# Patient Record
Sex: Male | Born: 1975 | Race: White | Hispanic: No | Marital: Married | Smoking: Former smoker
Health system: Southern US, Community
[De-identification: ages and names within clinical notes are randomized; demographics above are authoritative.]

## PROBLEM LIST (undated history)

## (undated) DIAGNOSIS — J45909 Unspecified asthma, uncomplicated: Secondary | ICD-10-CM

## (undated) DIAGNOSIS — Z720 Tobacco use: Secondary | ICD-10-CM

## (undated) DIAGNOSIS — J449 Chronic obstructive pulmonary disease, unspecified: Secondary | ICD-10-CM

## (undated) HISTORY — DX: Tobacco use: Z72.0

## (undated) HISTORY — PX: DENTAL SURGERY: SHX609

---

## 2005-04-24 ENCOUNTER — Emergency Department (HOSPITAL_COMMUNITY): Admission: EM | Admit: 2005-04-24 | Discharge: 2005-04-24 | Payer: Self-pay | Admitting: Emergency Medicine

## 2011-01-27 ENCOUNTER — Other Ambulatory Visit: Payer: Self-pay | Admitting: Physician Assistant

## 2011-01-27 ENCOUNTER — Ambulatory Visit
Admission: RE | Admit: 2011-01-27 | Discharge: 2011-01-27 | Disposition: A | Discharge status: Home / Self Care | Payer: BC Managed Care – PPO | Source: Ambulatory Visit | Attending: Physician Assistant | Admitting: Physician Assistant

## 2011-01-27 DIAGNOSIS — R05 Cough: Secondary | ICD-10-CM

## 2011-01-27 DIAGNOSIS — R059 Cough, unspecified: Secondary | ICD-10-CM

## 2011-01-27 DIAGNOSIS — R0602 Shortness of breath: Secondary | ICD-10-CM

## 2014-07-05 ENCOUNTER — Encounter (HOSPITAL_COMMUNITY): Payer: Self-pay

## 2014-07-05 ENCOUNTER — Emergency Department (HOSPITAL_COMMUNITY)
Admission: EM | Admit: 2014-07-05 | Discharge: 2014-07-05 | Disposition: A | Discharge status: Home / Self Care | Payer: BC Managed Care – PPO | Attending: Emergency Medicine | Admitting: Emergency Medicine

## 2014-07-05 ENCOUNTER — Emergency Department (HOSPITAL_COMMUNITY): Payer: BC Managed Care – PPO

## 2014-07-05 DIAGNOSIS — Z720 Tobacco use: Secondary | ICD-10-CM | POA: Diagnosis not present

## 2014-07-05 DIAGNOSIS — R0602 Shortness of breath: Secondary | ICD-10-CM | POA: Insufficient documentation

## 2014-07-05 DIAGNOSIS — R109 Unspecified abdominal pain: Secondary | ICD-10-CM

## 2014-07-05 DIAGNOSIS — K529 Noninfective gastroenteritis and colitis, unspecified: Secondary | ICD-10-CM

## 2014-07-05 DIAGNOSIS — J441 Chronic obstructive pulmonary disease with (acute) exacerbation: Secondary | ICD-10-CM | POA: Diagnosis not present

## 2014-07-05 DIAGNOSIS — R112 Nausea with vomiting, unspecified: Secondary | ICD-10-CM | POA: Diagnosis present

## 2014-07-05 HISTORY — DX: Chronic obstructive pulmonary disease, unspecified: J44.9

## 2014-07-05 HISTORY — DX: Unspecified asthma, uncomplicated: J45.909

## 2014-07-05 LAB — I-STAT TROPONIN, ED: Troponin i, poc: 0 ng/mL (ref 0.00–0.08)

## 2014-07-05 LAB — CBC WITH DIFFERENTIAL/PLATELET
BASOS ABS: 0 10*3/uL (ref 0.0–0.1)
BASOS PCT: 0 % (ref 0–1)
EOS PCT: 2 % (ref 0–5)
Eosinophils Absolute: 0.4 10*3/uL (ref 0.0–0.7)
HCT: 46.2 % (ref 39.0–52.0)
Hemoglobin: 16.1 g/dL (ref 13.0–17.0)
Lymphocytes Relative: 7 % — ABNORMAL LOW (ref 12–46)
Lymphs Abs: 1.1 10*3/uL (ref 0.7–4.0)
MCH: 31.1 pg (ref 26.0–34.0)
MCHC: 34.8 g/dL (ref 30.0–36.0)
MCV: 89.2 fL (ref 78.0–100.0)
Monocytes Absolute: 0.6 10*3/uL (ref 0.1–1.0)
Monocytes Relative: 4 % (ref 3–12)
NEUTROS PCT: 87 % — AB (ref 43–77)
Neutro Abs: 13.4 10*3/uL — ABNORMAL HIGH (ref 1.7–7.7)
PLATELETS: 223 10*3/uL (ref 150–400)
RBC: 5.18 MIL/uL (ref 4.22–5.81)
RDW: 12.8 % (ref 11.5–15.5)
WBC: 15.5 10*3/uL — ABNORMAL HIGH (ref 4.0–10.5)

## 2014-07-05 LAB — COMPREHENSIVE METABOLIC PANEL
ALBUMIN: 4.4 g/dL (ref 3.5–5.2)
ALK PHOS: 79 U/L (ref 39–117)
ALT: 44 U/L (ref 0–53)
AST: 30 U/L (ref 0–37)
Anion gap: 10 (ref 5–15)
BUN: 12 mg/dL (ref 6–23)
CO2: 21 mmol/L (ref 19–32)
Calcium: 9.4 mg/dL (ref 8.4–10.5)
Chloride: 107 mmol/L (ref 96–112)
Creatinine, Ser: 0.94 mg/dL (ref 0.50–1.35)
Glucose, Bld: 106 mg/dL — ABNORMAL HIGH (ref 70–99)
Potassium: 3.4 mmol/L — ABNORMAL LOW (ref 3.5–5.1)
Sodium: 138 mmol/L (ref 135–145)
Total Bilirubin: 1.4 mg/dL — ABNORMAL HIGH (ref 0.3–1.2)
Total Protein: 7.6 g/dL (ref 6.0–8.3)

## 2014-07-05 LAB — LIPASE, BLOOD: Lipase: 25 U/L (ref 11–59)

## 2014-07-05 MED ORDER — IOHEXOL 300 MG/ML  SOLN
25.0000 mL | Freq: Once | INTRAMUSCULAR | Status: AC | PRN
  Administered 2014-07-05: 25 mL via ORAL

## 2014-07-05 MED ORDER — HYDROMORPHONE HCL 1 MG/ML IJ SOLN
1.0000 mg | INTRAMUSCULAR | Status: DC | PRN
  Administered 2014-07-05: 1 mg via INTRAVENOUS
  Filled 2014-07-05: qty 1

## 2014-07-05 MED ORDER — SODIUM CHLORIDE 0.9 % IV SOLN
1000.0000 mL | INTRAVENOUS | Status: DC
  Administered 2014-07-05: 1000 mL via INTRAVENOUS

## 2014-07-05 MED ORDER — ONDANSETRON HCL 4 MG/2ML IJ SOLN
4.0000 mg | Freq: Once | INTRAMUSCULAR | Status: AC
  Administered 2014-07-05: 4 mg via INTRAVENOUS
  Filled 2014-07-05: qty 2

## 2014-07-05 MED ORDER — HYDROCODONE-ACETAMINOPHEN 5-325 MG PO TABS: 1.0000 | ORAL_TABLET | ORAL | Status: DC | PRN

## 2014-07-05 MED ORDER — ONDANSETRON 8 MG PO TBDP
8.0000 mg | ORAL_TABLET | Freq: Three times a day (TID) | ORAL | Status: DC | PRN
Start: 2014-07-05 — End: 2015-12-14

## 2014-07-05 MED ORDER — IOHEXOL 300 MG/ML  SOLN
100.0000 mL | Freq: Once | INTRAMUSCULAR | Status: AC | PRN
  Administered 2014-07-05: 100 mL via INTRAVENOUS

## 2014-07-05 MED ORDER — SODIUM CHLORIDE 0.9 % IV SOLN
1000.0000 mL | Freq: Once | INTRAVENOUS | Status: AC
  Administered 2014-07-05: 1000 mL via INTRAVENOUS

## 2014-07-05 NOTE — ED Notes (Signed)
Bed: NA35 Expected date: 07/05/14 Expected time:  Means of arrival:  Comments: HOld  for T 1

## 2014-07-05 NOTE — ED Notes (Signed)
Lab called not enough urine to run UA

## 2014-07-05 NOTE — ED Notes (Signed)
Pt c/o increasing SOB, generalized abdominal pain, and n/v/d starting around 0500.  Pain score 8/10.  Pt reports wife previously had a "stomach bug."  Pt is also concerned that he is having an allergic reaction to nuts.  Sts he ate an entire bag of gummy bears last night and the back had an nut warning.

## 2014-07-05 NOTE — Discharge Instructions (Signed)

## 2014-07-05 NOTE — ED Provider Notes (Signed)
CSN: [id number]     Arrival date & time 07/05/14  1714 History   First MD Initiated Contact with Patient 07/05/14 1730     Chief Complaint  Patient presents with  . Shortness of Breath  . Possible Allergic Reaction      HPI  Patient started having complaints of nausea and vomiting as well as diarrhea that started today. His wife and son had similar gastroenteritis illness recently. The patient's symptoms however escalated severely today. He started having severe abdominal cramping. The pain was diffuse. He started tingling in his hands and he began feeling short of breath. Patient went to an urgent care and was sent to the emergency room to have a CAT scan and further evaluation. Nothing seems to be helping.  Patient was also concerned about the possibility of this being related to a nut allergy. He did have an entire bag of gummy bears last night and on the back of the bag mentioned a nut allergy warning. Patient has not had any rashes. He denies any hives. He denies any throat swelling. Vomited times 5 Diarrhea 30 times  Past Medical History  Diagnosis Date  . COPD (chronic obstructive pulmonary disease)   . Asthma    Past Surgical History  Procedure Laterality Date  . Dental surgery     History reviewed. No pertinent family history. History  Substance Use Topics  . Smoking status: Current Every Day Smoker -- 0.25 packs/day    Types: Cigarettes  . Smokeless tobacco: Not on file  . Alcohol Use: Yes     Comment: rarely    Review of Systems  Constitutional: Negative for fever.  Respiratory: Positive for shortness of breath. Negative for cough.   Gastrointestinal: Positive for abdominal pain.  Genitourinary: Negative for dysuria.  All other systems reviewed and are negative.     Allergies  Fluvirin  Home Medications   Prior to Admission medications   Medication Sig Start Date End Date Taking? Authorizing Provider  HYDROcodone-acetaminophen (NORCO/VICODIN) 5-325 MG  per tablet Take 1-2 tablets by mouth every 4 (four) hours as needed. 07/05/14   Dorie Rank, MD  ondansetron (ZOFRAN ODT) 8 MG disintegrating tablet Take 1 tablet (8 mg total) by mouth every 8 (eight) hours as needed for nausea or vomiting. 07/05/14   Dorie Rank, MD   BP 118/72 mmHg  Pulse 87  Temp(Src) 98.1 F (36.7 C) (Oral)  Resp 20  SpO2 100% Physical Exam  Constitutional: He appears distressed.  HENT:  Head: Normocephalic and atraumatic.  Right Ear: External ear normal.  Left Ear: External ear normal.  Eyes: Conjunctivae are normal. Right eye exhibits no discharge. Left eye exhibits no discharge. No scleral icterus.  Neck: Neck supple. No tracheal deviation present.  Cardiovascular: Normal rate, regular rhythm and intact distal pulses.   Pulmonary/Chest: Effort normal and breath sounds normal. No stridor. No respiratory distress. He has no wheezes. He has no rales.  Abdominal: Soft. Bowel sounds are normal. He exhibits no distension. There is generalized tenderness. There is guarding. There is no rigidity and no rebound. No hernia.  Musculoskeletal: He exhibits no edema or tenderness.  Neurological: He is alert. He has normal strength. No cranial nerve deficit (no facial droop, extraocular movements intact, no slurred speech) or sensory deficit. He exhibits normal muscle tone. He displays no seizure activity. Coordination normal.  Skin: Skin is warm and dry. No rash noted.  Psychiatric: He has a normal mood and affect.  Nursing note and vitals reviewed.  ED Course  Procedures (including critical care time) Labs Review Labs Reviewed  CBC WITH DIFFERENTIAL/PLATELET - Abnormal; Notable for the following:    WBC 15.5 (*)    Neutrophils Relative % 87 (*)    Neutro Abs 13.4 (*)    Lymphocytes Relative 7 (*)    All other components within normal limits  COMPREHENSIVE METABOLIC PANEL - Abnormal; Notable for the following:    Potassium 3.4 (*)    Glucose, Bld 106 (*)    Total  Bilirubin 1.4 (*)    All other components within normal limits  LIPASE, BLOOD  I-STAT TROPOININ, ED    Imaging Review Ct Abdomen Pelvis W Contrast  07/05/2014   CLINICAL DATA:  Nausea, vomiting, and diarrhea starting at 5 a.m. Increasing shortness of breath. Abdominal pain.  EXAM: CT ABDOMEN AND PELVIS WITH CONTRAST  TECHNIQUE: Multidetector CT imaging of the abdomen and pelvis was performed using the standard protocol following bolus administration of intravenous contrast.  CONTRAST:  9m OMNIPAQUE IOHEXOL 300 MG/ML SOLN, 1015mOMNIPAQUE IOHEXOL 300 MG/ML SOLN  COMPARISON:  None.  FINDINGS: Lower chest: Dependent subsegmental atelectasis in both lower lobes.  Hepatobiliary: Mild diffuse hepatic steatosis.  Pancreas: Unremarkable  Spleen: Unremarkable  Adrenals/Urinary Tract: 1.6 by 1.2 by 1.1 cm left kidney upper pole hypodense lesion, internal density measuring between 20 and 30 Hounsfield units today on the portal venous phase images, but septic to volume averaging.  Stomach/Bowel: Several borderline dilated loops of jejunum noted also with borderline fold thickening. Fatty deposition in the wall of the terminal ileum and ascending colon noted, probably incidental but with a very week association for inflammatory bowel disease. Appendix normal. Air- fluid levels in the distal colon compatible with diarrheal process.  Vascular/Lymphatic: Unremarkable  Reproductive: Unremarkable  Other: No supplemental non-categorized findings.  Musculoskeletal: Unremarkable  IMPRESSION: 1. Air- fluid levels in the distal colon compatible with diarrheal process. 2. Several borderline loops of proximal small bowel noted with borderline fold thickening, as might be seen in low grade proximal enteritis. No findings of obstruction. 3. Technically nonspecific left kidney upper pole hypodense lesion based on volume averaging, although statistically likely to be a cyst or similar benign lesion.   Electronically Signed   By:  WaVan Clines.D.   On: 07/05/2014 20:20     EKG Interpretation   Date/Time:  Sunday July 05 2014 17:23:21 EST Ventricular Rate:  65 PR Interval:  135 QRS Duration: 96 QT Interval:  391 QTC Calculation: 406 R Axis:   57 Text Interpretation:  Sinus rhythm Probable anteroseptal infarct, old ST  elevation, consider inferior injury Baseline wander in lead(s) V3 No  significant change since last tracing Confirmed by Jacson Rapaport  MD-J, Jurrell Royster  ([id number] 07/05/2014 5:31:16 PM     Medications  0.9 %  sodium chloride infusion (0 mLs Intravenous Stopped 07/05/14 1931)    Followed by  0.9 %  sodium chloride infusion (1,000 mLs Intravenous New Bag/Given 07/05/14 1756)  HYDROmorphone (DILAUDID) injection 1 mg (1 mg Intravenous Given 07/05/14 1756)  ondansetron (ZOFRAN) injection 4 mg (4 mg Intravenous Given 07/05/14 1756)  iohexol (OMNIPAQUE) 300 MG/ML solution 25 mL (25 mLs Oral Contrast Given 07/05/14 2001)  iohexol (OMNIPAQUE) 300 MG/ML solution 100 mL (100 mLs Intravenous Contrast Given 07/05/14 2001)    MDM   Final diagnoses:  Abdominal pain, acute  Gastroenteritis   Patient improved with IV fluids, pain medications and antinausea medications. CT scan is consistent with an enteritis. I suspect he has the  same illness that his family members have had. There is no evidence of obstruction, perforation, diverticulitis or appendicitis. Patient will be discharged home with medications for pain to help him with the cramping as well as anti emetics.  At this time there does not appear to be any evidence of an acute emergency medical condition and the patient appears stable for discharge with appropriate outpatient follow up.     Dorie Rank, MD 07/05/14 2128

## 2014-07-05 NOTE — ED Notes (Signed)
Pt given urinal and instructed to ring call bell when he provides urine specimen

## 2015-02-08 ENCOUNTER — Ambulatory Visit
Admission: RE | Admit: 2015-02-08 | Discharge: 2015-02-08 | Disposition: A | Discharge status: Home / Self Care | Payer: BC Managed Care – PPO | Source: Ambulatory Visit | Attending: Physician Assistant | Admitting: Physician Assistant

## 2015-02-08 ENCOUNTER — Other Ambulatory Visit: Payer: Self-pay | Admitting: Physician Assistant

## 2015-02-08 DIAGNOSIS — R05 Cough: Secondary | ICD-10-CM

## 2015-02-08 DIAGNOSIS — R059 Cough, unspecified: Secondary | ICD-10-CM

## 2015-02-08 DIAGNOSIS — R0602 Shortness of breath: Secondary | ICD-10-CM

## 2015-08-04 IMAGING — CT CT ABD-PELV W/ CM
1 of 2 series · 15 of 32 positions shown, 19 images · IV contrast (OMNIPAQUE 300)
Comparison: None.

CLINICAL DATA: Nausea, vomiting, and diarrhea starting at 5 a.m.
Increasing shortness of breath. Abdominal pain.

EXAM:
CT ABDOMEN AND PELVIS WITH CONTRAST
TECHNIQUE: Multidetector CT imaging of the abdomen and pelvis was performed
using the standard protocol following bolus administration of
intravenous contrast.
CONTRAST:  25mL OMNIPAQUE IOHEXOL 300 MG/ML SOLN, 100mL OMNIPAQUE
IOHEXOL 300 MG/ML SOLN

[Series 2: abd/pel with · axial · 0.80mm/px · z∈[+1015,+1470]mm · 15 of 101 slices shown, 19 images]
[im 5/101  soft-tissue]
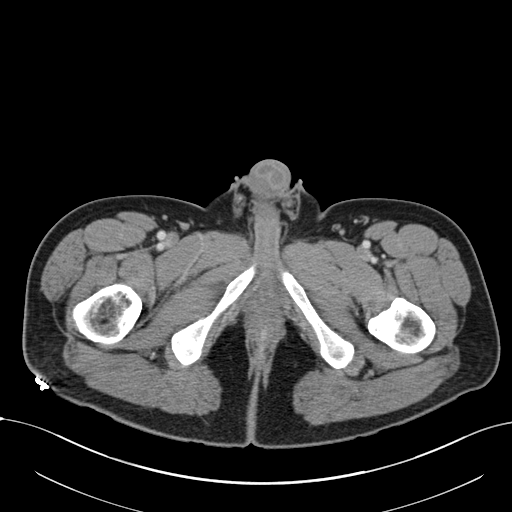
[im 5/101  bone]
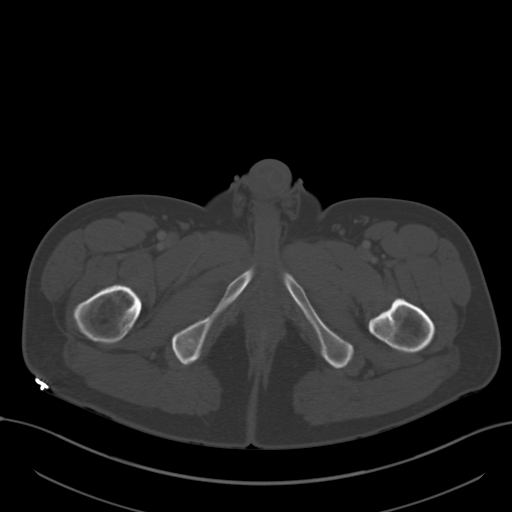
[im 14/101  soft-tissue]
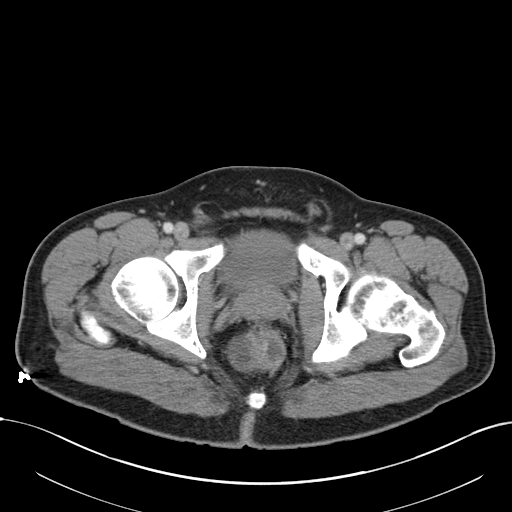
[im 22/101  soft-tissue]
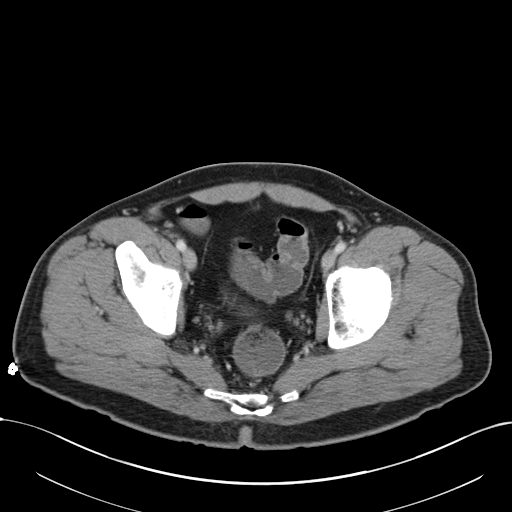
[im 27/101  soft-tissue]
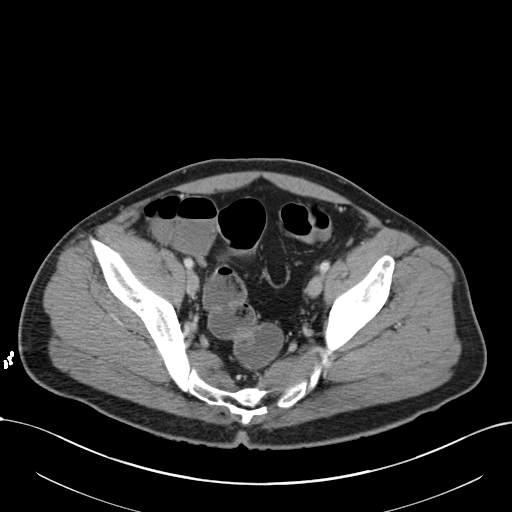
[im 35/101  soft-tissue]
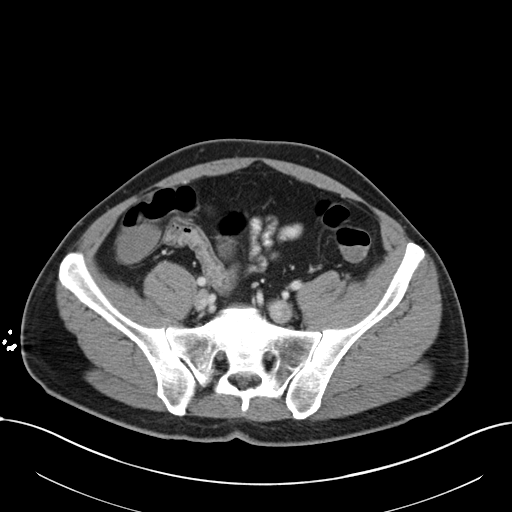
[im 44/101  soft-tissue]
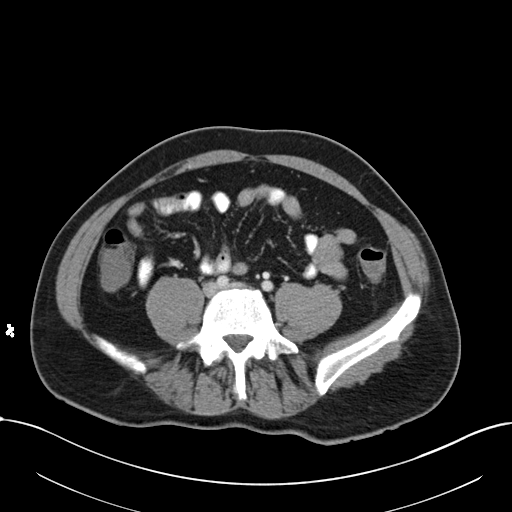
[im 53/101  soft-tissue]
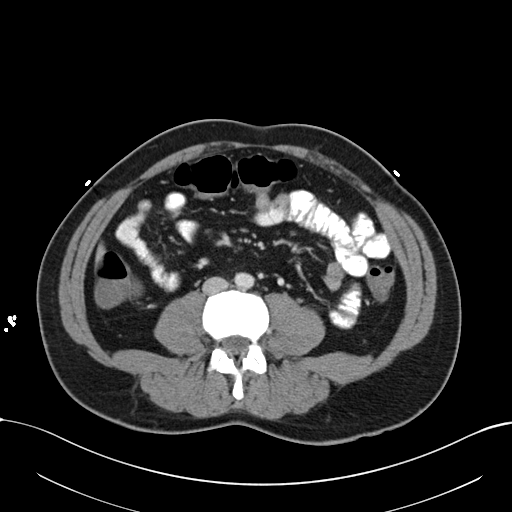
[im 57/101  soft-tissue]
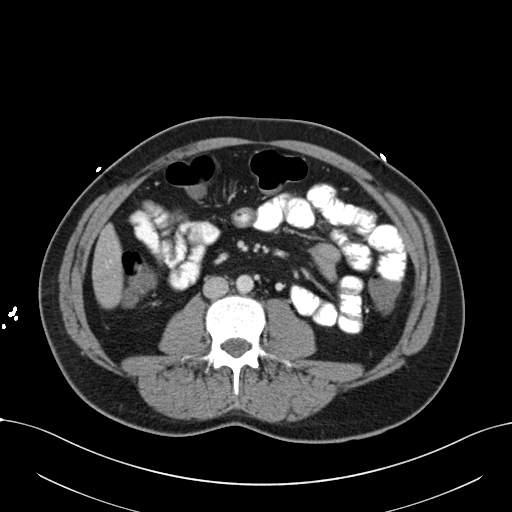
[im 66/101  soft-tissue]
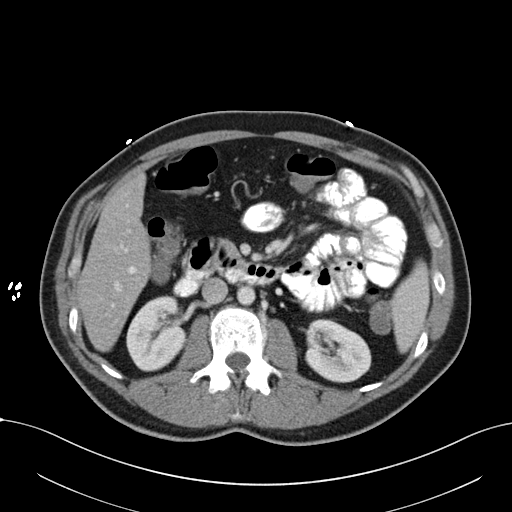
[im 66/101  bone]
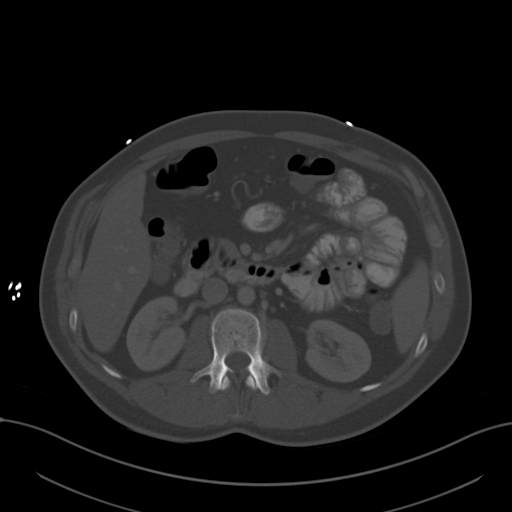
[im 74/101  soft-tissue]
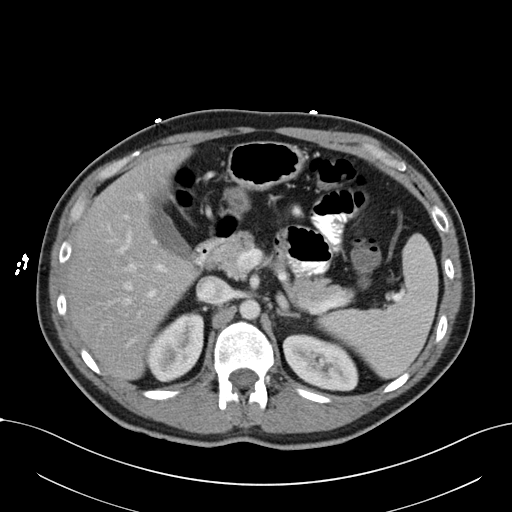
[im 79/101  soft-tissue]
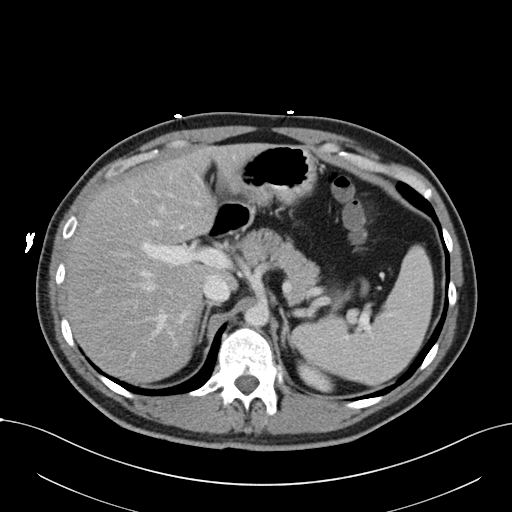
[im 83/101  lung]
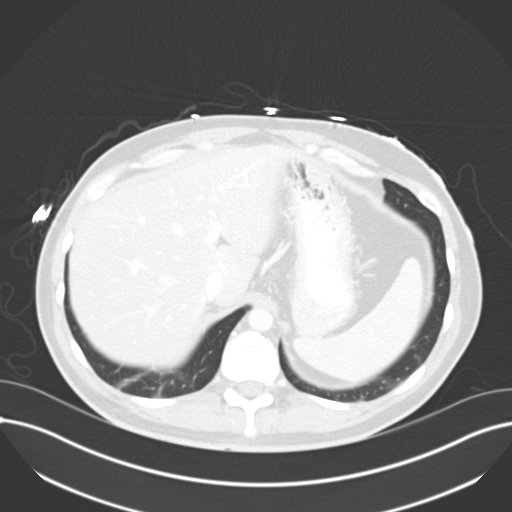
[im 87/101  soft-tissue]
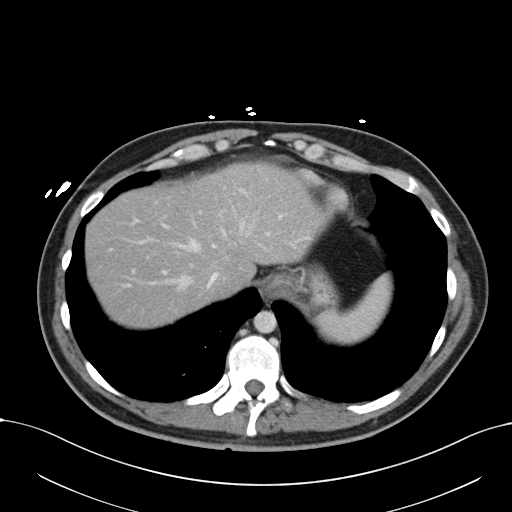
[im 87/101  lung]
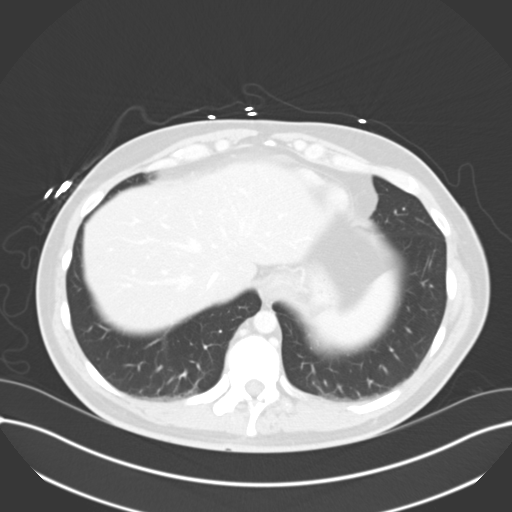
[im 92/101  lung]
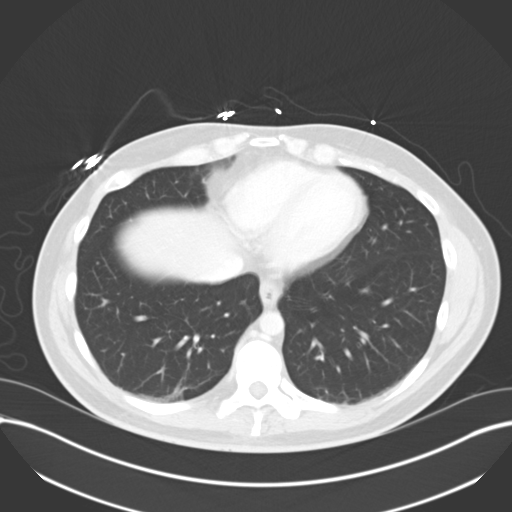
[im 96/101  soft-tissue]
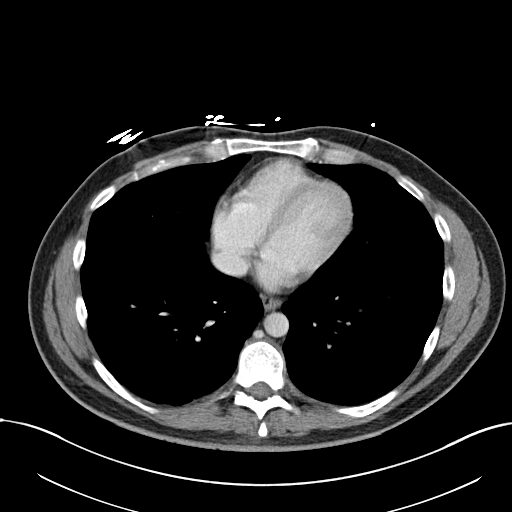
[im 96/101  lung]
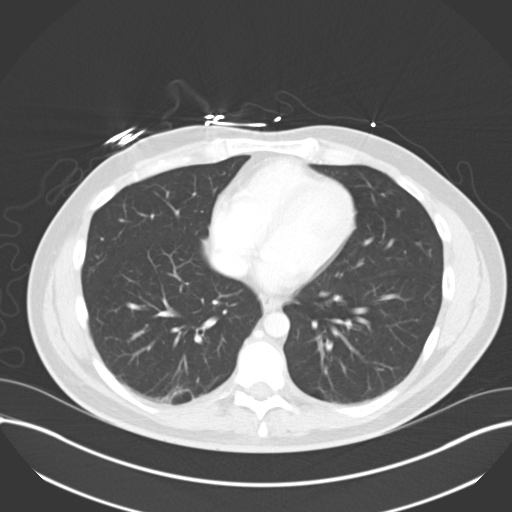

[15 of 32 positions shown; findings below may reference images not displayed]

FINDINGS: Lower chest: Dependent subsegmental atelectasis in both lower lobes.

Hepatobiliary: Mild diffuse hepatic steatosis.

Pancreas: Unremarkable

Spleen: Unremarkable

Adrenals/Urinary Tract: 1.6 by 1.2 by 1.1 cm left kidney upper pole
hypodense lesion, internal density measuring between 20 and 30
Hounsfield units today on the portal venous phase images, but septic
to volume averaging.

Stomach/Bowel: Several borderline dilated loops of jejunum noted
also with borderline fold thickening. Fatty deposition in the wall
of the terminal ileum and ascending colon noted, probably incidental
but with a very week association for inflammatory bowel disease.
Appendix normal. Air- fluid levels in the distal colon compatible
with diarrheal process.

Vascular/Lymphatic: Unremarkable

Reproductive: Unremarkable

Other: No supplemental non-categorized findings.

Musculoskeletal: Unremarkable
IMPRESSION: 1. Air- fluid levels in the distal colon compatible with diarrheal
process.
2. Several borderline loops of proximal small bowel noted with
borderline fold thickening, as might be seen in low grade proximal
enteritis. No findings of obstruction.
3. Technically nonspecific left kidney upper pole hypodense lesion
based on volume averaging, although statistically likely to be a
cyst or similar benign lesion.

## 2015-12-14 ENCOUNTER — Encounter: Payer: Self-pay | Admitting: Allergy and Immunology

## 2015-12-14 ENCOUNTER — Encounter (INDEPENDENT_AMBULATORY_CARE_PROVIDER_SITE_OTHER): Payer: Self-pay

## 2015-12-14 ENCOUNTER — Ambulatory Visit (INDEPENDENT_AMBULATORY_CARE_PROVIDER_SITE_OTHER): Payer: BC Managed Care – PPO | Admitting: Allergy and Immunology

## 2015-12-14 VITALS — BP 122/86 | HR 80 | Resp 20 | Ht 70.39 in | Wt 185.0 lb

## 2015-12-14 DIAGNOSIS — Z7722 Contact with and (suspected) exposure to environmental tobacco smoke (acute) (chronic): Secondary | ICD-10-CM

## 2015-12-14 DIAGNOSIS — H1010 Acute atopic conjunctivitis, unspecified eye: Secondary | ICD-10-CM | POA: Diagnosis not present

## 2015-12-14 DIAGNOSIS — J309 Allergic rhinitis, unspecified: Secondary | ICD-10-CM

## 2015-12-14 DIAGNOSIS — J4531 Mild persistent asthma with (acute) exacerbation: Secondary | ICD-10-CM

## 2015-12-14 MED ORDER — BUDESONIDE 32 MCG/ACT NA SUSP: 1.0000 | Freq: Every day | NASAL | 0 refills | Status: AC

## 2015-12-14 MED ORDER — FLUTICASONE FUROATE 200 MCG/ACT IN AEPB: 1.0000 | INHALATION_SPRAY | Freq: Every day | RESPIRATORY_TRACT | 5 refills | Status: AC

## 2015-12-14 MED ORDER — METHYLPREDNISOLONE ACETATE 80 MG/ML IJ SUSP
80.0000 mg | Freq: Once | INTRAMUSCULAR | Status: AC
  Administered 2015-12-14: 80 mg via INTRAMUSCULAR

## 2015-12-14 NOTE — Patient Instructions (Addendum)
  1. Allergen avoidance measures and eliminate indoor tobacco smoke exposure  2. Treat and prevent inflammation:   A. continue montelukast 10 mg tablet 1 time a day  B. start OTC Rhinocort one spray each nostril one time a day. Coupon. Sample  C. start Arnuity 200 one inhalation 1 time per day. Coupon. Sample.  D. Depo-Medrol 80 IM delivered in clinic today  3. If needed:   A. OTC antihistamine - Claritin/Zyrtec/Allegra   B. ProAir HFA 2 puffs every 4-6 hours  4. Consider immunotherapy?  5. Return to clinic in 4 weeks or earlier if problem   6. Obtain fall flu vaccine

## 2015-12-14 NOTE — Progress Notes (Signed)
Dear Lennie Odor,  Thank you for referring Logan Mora to the [facility] on 12/14/2015.   Below is a summation of this patient's evaluation and recommendations.  Thank you for your referral. I will keep you informed about this patient's response to treatment.   If you have any questions please to do hesitate to contact me.   Sincerely,  Jiles Prows, MD [facility]   ______________________________________________________________________    NEW PATIENT NOTE  Referring Provider: Lennie Odor, PA-C Primary Provider: Donnie Coffin, MD Date of office visit: 12/14/2015    Subjective:   Chief Complaint:  Logan Mora (DOB: 26-Feb-1976) is a 40 y.o. male with a chief complaint of Asthma  who presents to the clinic on 12/14/2015 with the following problems:  HPI: Logan Mora presents this clinic in evaluation of asthma and allergic rhinitis.  Logan Mora has a long history of a upper airway issues dating back to middle school presenting as nasal congestion and sneezing and nose blowing and itchy red watery eyes. This has been somewhat progressive and has been quite significant since Logan Mora obtained his dog in June 2017. Logan Mora's been using montelukast which Logan Mora thinks does help both his nose and eyes yet Logan Mora still remains somewhat symptomatic.  Logan Mora has had problems with asthma for at least 10 years requiring an emergency room evaluation 10 years ago at which time Logan Mora was given a short acting bronchodilator. Apparently Logan Mora was doing relatively well while relying on the use of a short acting bronchodilator but Logan Mora has noticed since June that Logan Mora has increased his requirement for short acting bronchodilator up to 3 times per day for issues with wheezing and coughing and shortness of breath. Logan Mora will wake up around 5:00 in the morning with wheezing and coughing and mucus production and Logan Mora must use a short  acting bronchodilator at that point. Once again Logan Mora thinks that this is somewhat worse since Logan Mora obtained his dog in June 2017. Logan Mora did require a emergency room evaluation back in September 2016 for a febrile illness associated with an asthma flare at which time Logan Mora was started on a controller agent in addition to a short acting bronchodilator but it does not sound as though Logan Mora continued on his controller agent and just relied on the use of a short-acting bronchodilator.  Past Medical History:  Diagnosis Date  . Asthma   . COPD (chronic obstructive pulmonary disease) ([place])   . Tobacco abuse     Past Surgical History:  Procedure Laterality Date  . DENTAL SURGERY    . DENTAL SURGERY     Dental Implants      Medication List      montelukast 10 MG tablet Commonly known as:  SINGULAIR Take 10 mg by mouth daily.   PROAIR HFA 108 (90 Base) MCG/ACT inhaler Generic drug:  albuterol Inhale two puffs every four to six hours as needed for cough or wheeze.       Allergies  Allergen Reactions  . Fluvirin [Influenza Virus Vaccine Split] Shortness Of Breath    Gets the Flu, body aches    Review of systems negative except as noted in HPI / PMHx or noted below:  Review of Systems  Constitutional: Negative.   HENT: Negative.   Eyes: Negative.   Respiratory: Negative.   Cardiovascular: Negative.   Gastrointestinal: Negative.        Developed very significant problems  with abdominal pain and diarrhea approximately 24 hours after eating pecans. Logan Mora never has any associated systemic or constitutional symptoms and Logan Mora can eat all other tree nuts with no problem.  Genitourinary: Negative.   Musculoskeletal: Negative.   Skin: Negative.   Neurological: Negative.   Endo/Heme/Allergies: Negative.   Psychiatric/Behavioral: Negative.     Family History  Problem Relation Age of Onset  . Bone cancer Maternal Grandmother   . Heart disease Maternal Grandfather   . Heart disease Paternal Grandfather      Social History   Social History  . Marital status: Married    Spouse name: N/A  . Number of children: N/A  . Years of education: N/A   Occupational History  . Not on file.   Social History Main Topics  . Smoking status: Former Smoker    Packs/day: 0.25    Types: Cigarettes  . Smokeless tobacco: Never Used  . Alcohol use Yes     Comment: rarely  . Drug use: No  . Sexual activity: Not on file   Other Topics Concern  . Not on file   Social History Narrative  . No narrative on file    Environmental and Social history  Lives in a house with a dry environment, a dog located inside the household, wood flooring in the bedroom, no plastic on the bed or pillow, and a wife who smokes tobacco products indoors. Logan Mora will occasionally smoke a few cigarettes very intermittently if his wife is smoking around him. 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   Objective:   Vitals:   12/14/15 1018  BP: 122/86  Pulse: 80  Resp: 20   Height: 5' 10.39" (178.8 cm) Weight: 185 lb (83.9 kg)  Physical Exam  Constitutional: Logan Mora is well-developed, well-nourished, and in no distress.  HENT:  Head: Normocephalic. Head is without right periorbital erythema and without left periorbital erythema.  Right Ear: Tympanic membrane, external ear and ear canal normal.  Left Ear: Tympanic membrane, external ear and ear canal normal.  Nose: Mucosal edema present. No rhinorrhea.  Mouth/Throat: Oropharynx is clear and moist and mucous membranes are normal. No oropharyngeal exudate.  Eyes: Conjunctivae and lids are normal. Pupils are equal, round, and reactive to light.  Neck: Trachea normal. No tracheal deviation present. No thyromegaly present.  Cardiovascular: Normal rate, regular rhythm, S1 normal, S2 normal and normal heart sounds.   No murmur heard. Pulmonary/Chest: Effort normal. No stridor. No tachypnea. No respiratory distress. Logan Mora has no wheezes. Logan Mora has no rales. Logan Mora exhibits no tenderness.  Abdominal: Soft. Logan Mora exhibits  no distension and no mass. There is no hepatosplenomegaly. There is no tenderness. There is no rebound and no guarding.  Musculoskeletal: Logan Mora exhibits no edema or tenderness.  Lymphadenopathy:       Head (right side): No tonsillar adenopathy present.       Head (left side): No tonsillar adenopathy present.    Logan Mora has no cervical adenopathy.    Logan Mora has no axillary adenopathy.  Neurological: Logan Mora is alert. Gait normal.  Skin: No rash noted. Logan Mora is not diaphoretic. No erythema. No pallor. Nails show no clubbing.  Psychiatric: Mood and affect normal.     Diagnostics: Allergy skin tests were performed. Logan Mora demonstrated severe hypersensitivity against dog and cat and slight hypersensitivity against house dust mite and trees. Logan Mora did not demonstrate any hypersensitivity against pecan  Spirometry was performed and demonstrated an FEV1 of 4.26 @ 102 % of predicted. Following the administration of nebulized albuterol his FEV1  did not change significantly.  The patient had an Asthma Control Test with the following results:  .     Assessment and Plan:    1. Asthma, not well controlled, mild persistent, with acute exacerbation   2. Allergic rhinoconjunctivitis   3. Second hand tobacco smoke exposure     1. Allergen avoidance measures and eliminate indoor tobacco smoke exposure  2. Treat and prevent inflammation:   A. continue montelukast 10 mg tablet 1 time a day  B. start OTC Rhinocort one spray each nostril one time a day. Coupon. Sample  C. start Arnuity 200 one inhalation 1 time per day. Coupon. Sample.  D. Depo-Medrol 80 IM delivered in clinic today  3. If needed:   A. OTC antihistamine - Claritin/Zyrtec/Allegra   B. ProAir HFA 2 puffs every 4-6 hours  4. Consider immunotherapy?  5. Return to clinic in 4 weeks or earlier if problem   6. Obtain fall flu vaccine  I will have Logan Mora focus on allergen avoidance measures, eliminating the respiratory irritant of smoke exposure, and have him  use anti-inflammatory agents for both his upper and lower airways and we'll see what type of response Logan Mora will receive over the course the next 4 weeks or so. Logan Mora would definitely be a candidate for immunotherapy should Logan Mora fail this form of treatment. I did give him literature on immunotherapy during today's visit. I'll regroup with him in 4 weeks or earlier if there is a problem. I did encourage him to get the flu vaccine this fall.  Jiles Prows, MD [facility]

## 2016-01-18 ENCOUNTER — Ambulatory Visit: Payer: BC Managed Care – PPO | Admitting: Allergy and Immunology

## 2016-03-09 IMAGING — CR DG CHEST 2V
2 series · 2 of 2 positions shown · non-contrast
Comparison: 03/01/2011

CLINICAL DATA: Cough for 3 weeks, congestion, wheezing, chills.

EXAM:
CHEST  2 VIEW

[view not recorded (1 of 2)]
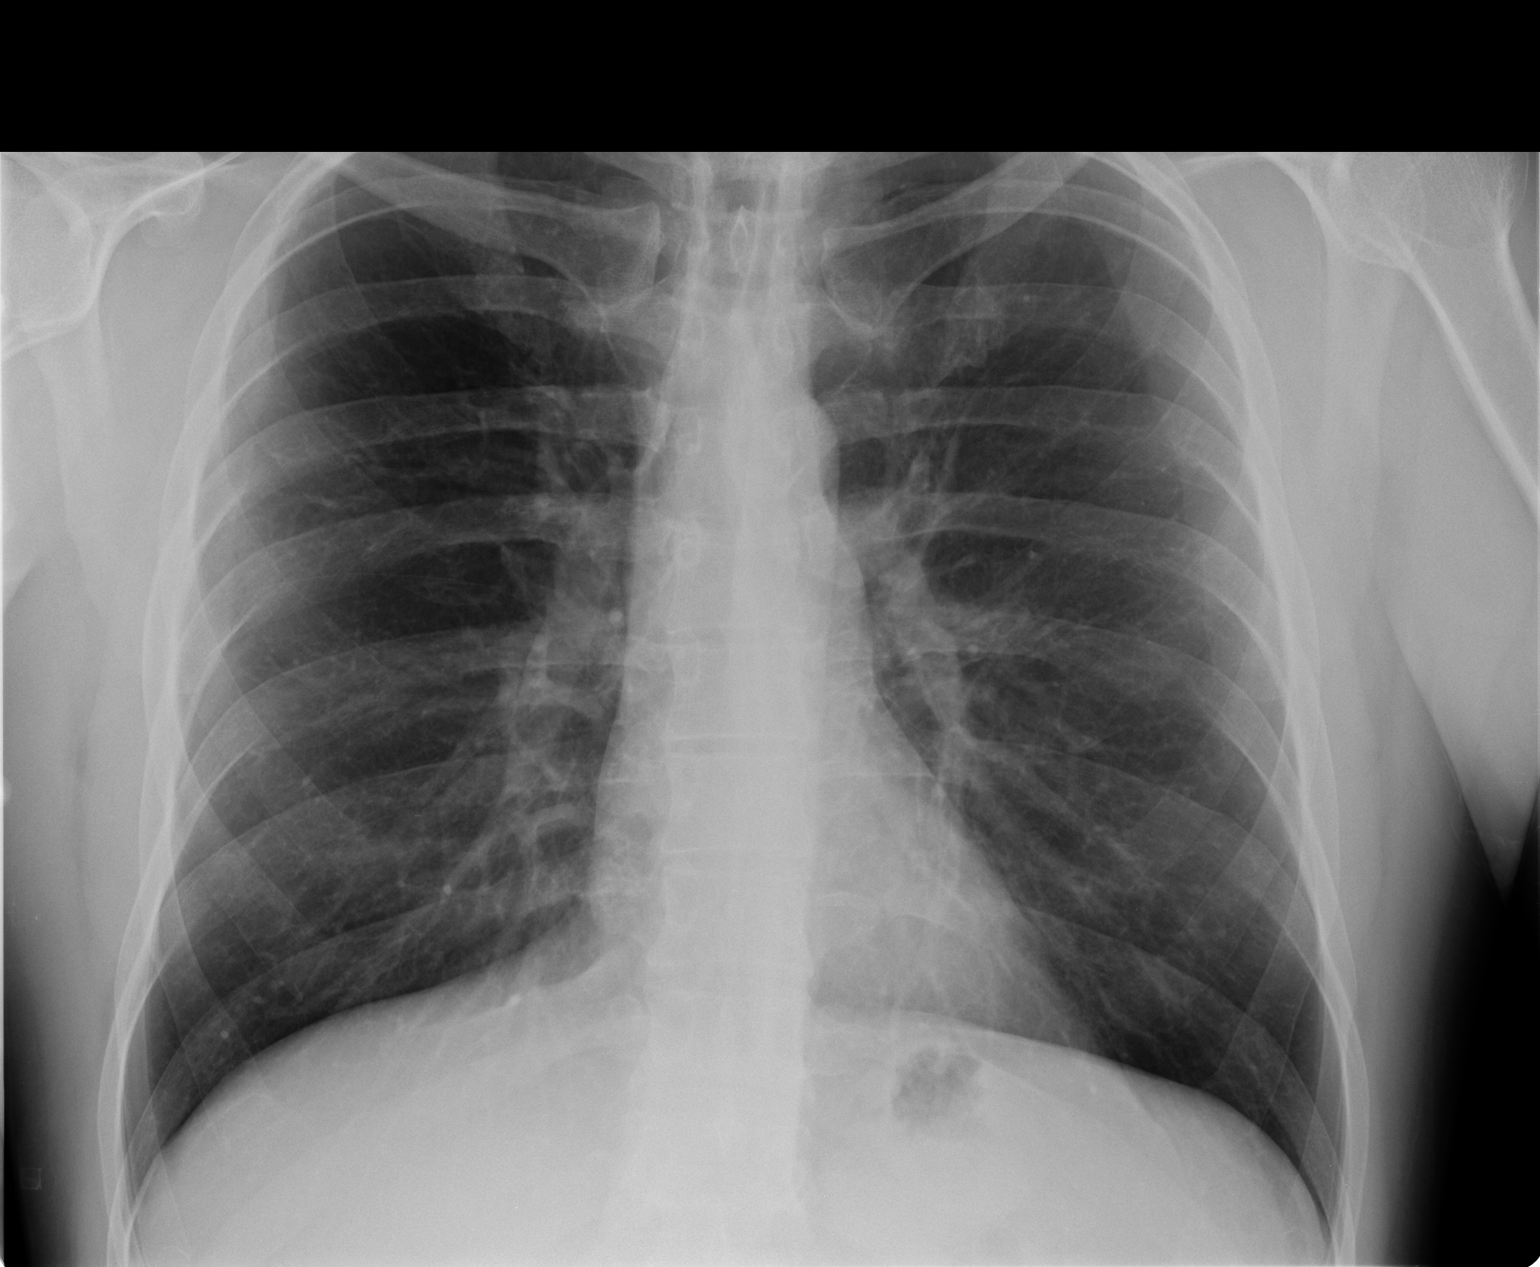

[view not recorded (2 of 2)]
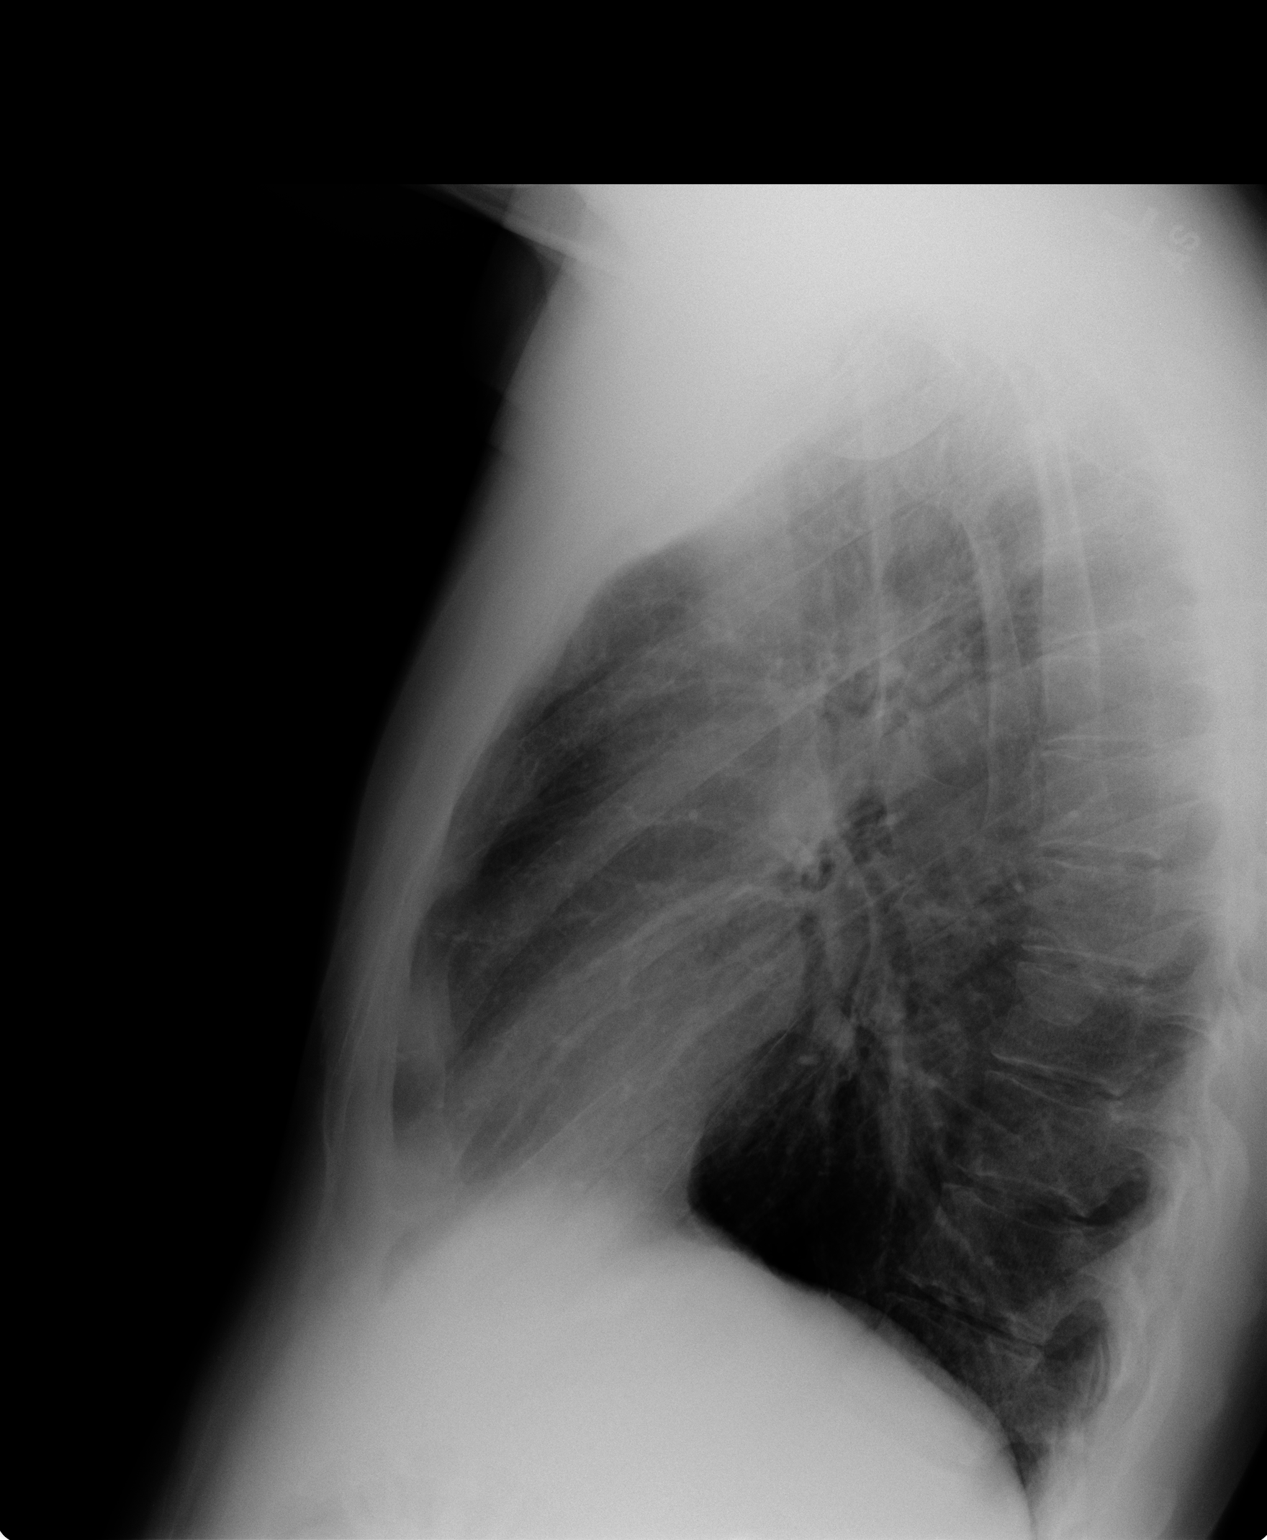

[2 of 2 positions shown; findings below may reference images not displayed]

FINDINGS: The heart size and mediastinal contours are within normal limits.
Both lungs are clear. The visualized skeletal structures are
unremarkable.
IMPRESSION: No active cardiopulmonary disease.

## 2019-06-21 ENCOUNTER — Ambulatory Visit: Payer: BC Managed Care – PPO

## 2019-09-10 ENCOUNTER — Other Ambulatory Visit: Payer: Self-pay

## 2019-09-10 ENCOUNTER — Ambulatory Visit (INDEPENDENT_AMBULATORY_CARE_PROVIDER_SITE_OTHER): Payer: BC Managed Care – PPO | Admitting: Ophthalmology

## 2019-09-10 ENCOUNTER — Encounter (INDEPENDENT_AMBULATORY_CARE_PROVIDER_SITE_OTHER): Payer: Self-pay | Admitting: Ophthalmology

## 2019-09-10 DIAGNOSIS — H353132 Nonexudative age-related macular degeneration, bilateral, intermediate dry stage: Secondary | ICD-10-CM | POA: Diagnosis not present

## 2019-09-10 NOTE — Progress Notes (Signed)
09/10/2019     CHIEF COMPLAINT Patient presents for Retina Evaluation   HISTORY OF PRESENT ILLNESS: Logan Mora is a 44 y.o. male who presents to the clinic today for:   HPI    Retina Evaluation    In right eye.  This started 2 weeks ago.  Duration of 2 weeks.  Associated Symptoms Blind Spot.  Context:  distance vision, mid-range vision and near vision.          Comments    NP - self-referred for second opinion of blind spot OD  Pt c/o "blind spot" OD x 2 weeks approx. Pt sts the spot is located off-center and temporal. Pt sts the blind spot has improved since initial onset. Pt reports intermittent distortion OD x 2 weeks. **Pt sts this has occurred previously approx 3 years ago, and resolved on its own. Pt denies symptoms OS. Pt sts he was seen at [facility] and had a "dye test" but did not get intravitreal injection. Pt sts he was then seen by Dr. Baird Cancer and received series of Avastin x 3 in 2018. Pt reports occasional "wiggly line" OS. Pt sts when he was looking at his fingers x 1 week ago with OD, parts of his finger temporally seemed to be less clear. Pt reports continued distortion OD.        Last edited by Hurman Horn, MD on 09/10/2019  9:27 AM. (History)      Referring physician: Alroy Dust, [address], [place],  [place] [address]  HISTORICAL INFORMATION:   Selected notes from the [place]: No current outpatient medications on file. (Ophthalmic Drugs)   No current facility-administered medications for this visit. (Ophthalmic Drugs)   Current Outpatient Medications (Other)  Medication Sig  . budesonide (RHINOCORT ALLERGY) 32 MCG/ACT nasal spray Place 1 spray into both nostrils daily.  . Fluticasone Furoate (ARNUITY ELLIPTA) 200 MCG/ACT AEPB Inhale 1 Dose into the lungs daily. Rinse, gargle, and spit after use.  . montelukast (SINGULAIR) 10 MG tablet Take 10 mg by mouth daily.   Marland Kitchen PROAIR HFA 108 (90  Base) MCG/ACT inhaler Inhale two puffs every four to six hours as needed for cough or wheeze.   No current facility-administered medications for this visit. (Other)      REVIEW OF SYSTEMS:    ALLERGIES Allergies  Allergen Reactions  . Fluvirin [Influenza Vac Split Quad] Shortness Of Breath    Gets the Flu, body aches    PAST MEDICAL HISTORY Past Medical History:  Diagnosis Date  . Asthma   . COPD (chronic obstructive pulmonary disease) ([place])   . Tobacco abuse    Past Surgical History:  Procedure Laterality Date  . DENTAL SURGERY    . DENTAL SURGERY     Dental Implants    FAMILY HISTORY Family History  Problem Relation Age of Onset  . Bone cancer Maternal Grandmother   . Heart disease Maternal Grandfather   . Heart disease Paternal Grandfather     SOCIAL HISTORY Social History   Tobacco Use  . Smoking status: Former Smoker    Packs/day: 0.25    Types: Cigarettes  . Smokeless tobacco: Never Used  Substance Use Topics  . Alcohol use: Yes    Comment: rarely  . Drug use: No         OPHTHALMIC EXAM:  Base Eye Exam    Visual Acuity (ETDRS)  Right Left   Dist [place] 20/25 +2 20/25 +2       Tonometry (Tonopen, 8:22 AM)      Right Left   Pressure 14 14       Pupils      Pupils Dark Light Shape React APD   Right PERRL 4 3 Round Brisk None   Left PERRL 4 3 Round Brisk None       Visual Fields (Counting fingers)      Left Right    Full Full       Extraocular Movement      Right Left    Full Full       Neuro/Psych    Oriented x3: Yes   Mood/Affect: Normal       Dilation    Both eyes: 1.0% Mydriacyl, 2.5% Phenylephrine @ 8:22 AM        Slit Lamp and Fundus Exam    External Exam      Right Left   External Normal Normal       Slit Lamp Exam      Right Left   Lids/Lashes Normal Normal   Conjunctiva/Sclera White and quiet White and quiet   Cornea Clear Clear   Anterior Chamber Deep and quiet Deep and quiet   Iris Round and  reactive Round and reactive   Lens Clear Clear   Anterior Vitreous Normal Normal       Fundus Exam      Right Left   Posterior Vitreous Normal Normal   Disc Normal Normal   C/D Ratio 0.3 0.4   Macula Soft drusen, no macular thickening, no membrane, Retinal pigment epithelial mottling, Early age related macular degeneration, Retinal pigment epithelial atrophy Soft drusen, no macular thickening, no membrane, Retinal pigment epithelial mottling, Early age related macular degeneration, Retinal pigment epithelial atrophy   Vessels Normal Normal   Periphery Normal no pisciform flecks seen in periphery Normal no pisciform flecks seen in periphery          IMAGING AND PROCEDURES  Imaging and Procedures for 09/13/19  OCT, Retina - OU - Both Eyes       Right Eye Quality was good. Scan locations included subfoveal. Central Foveal Thickness: 261. Findings include retinal drusen , cystoid macular edema.   Left Eye Quality was good. Central Foveal Thickness: 264.   Notes Micro CME, in [place] OD consider sleep apnea testing if patient snores, he does       Color Fundus Photography Optos - OU - Both Eyes       Right Eye Progression has no prior data. Disc findings include normal observations. Macula : drusen, geographic atrophy. Vessels : normal observations. Periphery : normal observations.   Left Eye Progression has no prior data. Disc findings include normal observations. Macula : geographic atrophy, drusen. Vessels : normal observations. Periphery : normal observations.   Notes Intermediate form of age-related maculopathy.       Fluorescein Angiography Optos (Transit OD)       Right Eye   Progression has no prior data. Early phase findings include window defect. Mid/Late phase findings include window defect, staining. Choroidal neovascularization is not present.   Left Eye   Progression has no prior data. Early phase findings include window defect. Mid/Late phase  findings include window defect, staining. Choroidal neovascularization is not present.   Notes Bilateral moderate to intermediate age-related maculopathy.  No evidence of CNVM.  No evidence of peripheral pisciform deposits.  No evidence for widespread  atrophy of Stargardt's disease                ASSESSMENT/PLAN:  No problem-specific Assessment & Plan notes found for this encounter.      ICD-10-CM   1. Intermediate stage nonexudative age-related macular degeneration of both eyes  H35.3132 OCT, Retina - OU - Both Eyes    Color Fundus Photography Optos - OU - Both Eyes    Fluorescein Angiography Optos (Transit OD)    1.  I spent much time with the patient discussing all factors that might lead to hypooxygenation of the macula given his likely genetic disposition to age-related macular degeneration.  Patient does report that he snores.  He has never had formal evaluation for sleep apnea 2.  Encourage patient to seek further investigation of this potentially download snore lab for screening purposes at home and then potentially arranging for sleep apnea testing formally with his PCP  3.  Otherwise looking for new onset distortions in his vision and at home monitoring with [place] grid as he is already performing  Ophthalmic Meds Ordered this visit:  No orders of the defined types were placed in this encounter.      Return in about 2 months (around 11/10/2019) for DILATE OU, OCT.  There are no Patient Instructions on file for this visit.   Explained the diagnoses, plan, and follow up with the patient and they expressed understanding.  Patient expressed understanding of the importance of proper follow up care.   Clent Demark Shalon Salado M.D. Diseases & Surgery of the Retina and Vitreous Retina & Diabetic [place] 09/13/19     Abbreviations: M myopia (nearsighted); A astigmatism; H hyperopia (farsighted); P presbyopia; Mrx spectacle prescription;  CTL contact lenses; OD right eye; OS  left eye; OU both eyes  XT exotropia; ET esotropia; PEK punctate epithelial keratitis; PEE punctate epithelial erosions; DES dry eye syndrome; MGD meibomian gland dysfunction; ATs artificial tears; PFAT's preservative free artificial tears; [place] nuclear sclerotic cataract; PSC posterior subcapsular cataract; ERM epi-retinal membrane; PVD posterior vitreous detachment; RD retinal detachment; DM diabetes mellitus; DR diabetic retinopathy; NPDR non-proliferative diabetic retinopathy; PDR proliferative diabetic retinopathy; CSME clinically significant macular edema; DME diabetic macular edema; dbh dot blot hemorrhages; CWS cotton wool spot; POAG primary open angle glaucoma; C/D cup-to-disc ratio; HVF humphrey visual field; GVF goldmann visual field; OCT optical coherence tomography; IOP intraocular pressure; BRVO Branch retinal vein occlusion; CRVO central retinal vein occlusion; CRAO central retinal artery occlusion; BRAO branch retinal artery occlusion; RT retinal tear; SB scleral buckle; PPV pars plana vitrectomy; VH Vitreous hemorrhage; PRP panretinal laser photocoagulation; IVK intravitreal kenalog; VMT vitreomacular traction; MH Macular hole;  NVD neovascularization of the disc; NVE neovascularization elsewhere; AREDS age related eye disease study; ARMD age related macular degeneration; POAG primary open angle glaucoma; EBMD epithelial/anterior basement membrane dystrophy; ACIOL anterior chamber intraocular lens; IOL intraocular lens; PCIOL posterior chamber intraocular lens; Phaco/IOL phacoemulsification with intraocular lens placement; [place] photorefractive keratectomy; LASIK laser assisted in situ keratomileusis; HTN hypertension; DM diabetes mellitus; COPD chronic obstructive pulmonary disease

## 2019-11-11 ENCOUNTER — Encounter (INDEPENDENT_AMBULATORY_CARE_PROVIDER_SITE_OTHER): Payer: BC Managed Care – PPO | Admitting: Ophthalmology

## 2019-11-18 ENCOUNTER — Other Ambulatory Visit: Payer: Self-pay

## 2019-11-18 ENCOUNTER — Encounter (INDEPENDENT_AMBULATORY_CARE_PROVIDER_SITE_OTHER): Payer: Self-pay | Admitting: Ophthalmology

## 2019-11-18 ENCOUNTER — Ambulatory Visit (INDEPENDENT_AMBULATORY_CARE_PROVIDER_SITE_OTHER): Payer: BC Managed Care – PPO | Admitting: Ophthalmology

## 2019-11-18 DIAGNOSIS — H353211 Exudative age-related macular degeneration, right eye, with active choroidal neovascularization: Secondary | ICD-10-CM

## 2019-11-18 DIAGNOSIS — H353212 Exudative age-related macular degeneration, right eye, with inactive choroidal neovascularization: Secondary | ICD-10-CM | POA: Insufficient documentation

## 2019-11-18 DIAGNOSIS — H3561 Retinal hemorrhage, right eye: Secondary | ICD-10-CM | POA: Diagnosis not present

## 2019-11-18 HISTORY — DX: Retinal hemorrhage, right eye: H35.61

## 2019-11-18 MED ORDER — BEVACIZUMAB CHEMO INJECTION 1.25MG/0.05ML SYRINGE FOR KALEIDOSCOPE
1.2500 mg | INTRAVITREAL | Status: AC | PRN
  Administered 2019-11-18: 1.25 mg via INTRAVITREAL

## 2019-11-18 NOTE — Progress Notes (Signed)
11/18/2019     CHIEF COMPLAINT Patient presents for Retina Follow Up   HISTORY OF PRESENT ILLNESS: Logan Mora is a 44 y.o. male who presents to the clinic today for:   HPI    Retina Follow Up    Patient presents with  Dry AMD.  In both eyes.  Duration of 2 months.  Since onset it is gradually worsening.          Comments    2 month follow up - OCT OU Patient states that his right eye has gotten worse since last visit. Patient states there is a blind spot that things go into.       Last edited by Gerda Diss on 11/18/2019  8:04 AM. (History)      Referring physician: Merrilee Seashore, MD [address] [place],  [place] [address]  HISTORICAL INFORMATION:   Selected notes from the MEDICAL RECORD NUMBER       CURRENT MEDICATIONS: No current outpatient medications on file. (Ophthalmic Drugs)   No current facility-administered medications for this visit. (Ophthalmic Drugs)   Current Outpatient Medications (Other)  Medication Sig  . budesonide (RHINOCORT ALLERGY) 32 MCG/ACT nasal spray Place 1 spray into both nostrils daily.  . Fluticasone Furoate (ARNUITY ELLIPTA) 200 MCG/ACT AEPB Inhale 1 Dose into the lungs daily. Rinse, gargle, and spit after use.  . montelukast (SINGULAIR) 10 MG tablet Take 10 mg by mouth daily.   Marland Kitchen PROAIR HFA 108 (90 Base) MCG/ACT inhaler Inhale two puffs every four to six hours as needed for cough or wheeze.   No current facility-administered medications for this visit. (Other)      REVIEW OF SYSTEMS:    ALLERGIES Allergies  Allergen Reactions  . Fluvirin [Influenza Vac Split Quad] Shortness Of Breath    Gets the Flu, body aches    PAST MEDICAL HISTORY Past Medical History:  Diagnosis Date  . Asthma   . COPD (chronic obstructive pulmonary disease) ([place])   . Tobacco abuse    Past Surgical History:  Procedure Laterality Date  . DENTAL SURGERY    . DENTAL SURGERY     Dental Implants    FAMILY  HISTORY Family History  Problem Relation Age of Onset  . Bone cancer Maternal Grandmother   . Heart disease Maternal Grandfather   . Heart disease Paternal Grandfather     SOCIAL HISTORY Social History   Tobacco Use  . Smoking status: Former Smoker    Packs/day: 0.25    Types: Cigarettes  . Smokeless tobacco: Never Used  Substance Use Topics  . Alcohol use: Yes    Comment: rarely  . Drug use: No         OPHTHALMIC EXAM:  Base Eye Exam    Visual Acuity (Snellen - Linear)      Right Left   Dist [place] 20/50 20/20-1   Dist ph [place] 20/40-2        Tonometry (Tonopen, 8:08 AM)      Right Left   Pressure 14 17       Pupils      Pupils Dark Light Shape React APD   Right PERRL 4 3 Round Brisk None   Left PERRL 4 3 Round Brisk None       Visual Fields (Counting fingers)      Left Right    Full Full       Extraocular Movement      Right Left    Full Full  Neuro/Psych    Oriented x3: Yes   Mood/Affect: Normal       Dilation    Both eyes: 1.0% Mydriacyl, 2.5% Phenylephrine @ 8:08 AM        Slit Lamp and Fundus Exam    External Exam      Right Left   External Normal Normal       Slit Lamp Exam      Right Left   Lids/Lashes Normal Normal   Conjunctiva/Sclera White and quiet White and quiet   Cornea Clear Clear   Anterior Chamber Deep and quiet Deep and quiet   Iris Round and reactive Round and reactive   Lens Clear Clear   Anterior Vitreous Normal Normal       Fundus Exam      Right Left   Posterior Vitreous Normal Normal   Disc Normal Normal   C/D Ratio 0.3 0.4   Macula Soft drusen,macular thickening, subretinal membrane, Retinal pigment epithelial mottling, Early age related macular degeneration, Retinal pigment epithelial atrophy, Subretinal hemorrhage Soft drusen, no macular thickening, no membrane, Retinal pigment epithelial mottling, Early age related macular degeneration, Retinal pigment epithelial atrophy   Vessels Normal Normal    Periphery Normal no pisciform flecks seen in periphery Normal no pisciform flecks seen in periphery          IMAGING AND PROCEDURES  Imaging and Procedures for 11/18/19  OCT, Retina - OU - Both Eyes       Right Eye Quality was good. Scan locations included subfoveal. Central Foveal Thickness: 341. Progression has worsened. Findings include subretinal fluid, choroidal neovascular membrane, pigment epithelial detachment.   Left Eye Quality was good. Scan locations included subfoveal. Central Foveal Thickness: 258. Progression has been stable. Findings include no SRF, no IRF.   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 Disc findings include normal observations. Macula : drusen, exudates, retinal pigment epithelium abnormalities, hemorrhage.   Left Eye Progression has been stable. Disc findings include normal observations. Macula : retinal pigment epithelium abnormalities, drusen.   Notes Subretinal fluid, subretinal hemorrhage noted temporal to the fovea.       Intravitreal Injection, Pharmacologic Agent - OD - Right Eye       Time Out 11/18/2019. 9:01 AM. Confirmed correct patient, procedure, site, and patient consented.   Anesthesia Topical anesthesia was used. Anesthetic medications included [place] 3.5%.   Procedure Preparation included Tobramycin 0.3%, 10% betadine to eyelids, 5% betadine to ocular surface. A 30 gauge needle was used.   Injection:  1.25 mg Bevacizumab (AVASTIN) SOLN   NDC: [address]-9, Lot: [id number]   Route: Intravitreal, Site: Right Eye, Waste: 0 mg  Post-op Post injection exam found visual acuity of at least counting fingers. The patient tolerated the procedure well. There were no  complications. The patient received written and verbal post procedure care education. Post injection medications were not given.                 ASSESSMENT/PLAN:  No problem-specific Assessment & Plan notes found for this encounter.      ICD-10-CM   1. Retinal hemorrhage, right eye  H35.61 OCT, Retina - OU - Both Eyes    Color Fundus Photography  Optos - OU - Both Eyes    Intravitreal Injection, Pharmacologic Agent - OD - Right Eye    Bevacizumab (AVASTIN) SOLN 1.25 mg  2. Exudative age-related macular degeneration of right eye with active choroidal neovascularization (HCC)  H35.3211 OCT, Retina - OU - Both Eyes    Intravitreal Injection, Pharmacologic Agent - OD - Right Eye    Bevacizumab (AVASTIN) SOLN 1.25 mg    1.  OD, new onset choroidal neovascular membrane as defined by OCT with subretinal fluid, elevation of the outer retina as well as subretinal hemorrhage clinically and documented on color fundus photography.  Intravitreal Avastin recommended.  Risk benefits reviewed.  2.  We will asked patient to return follow-up visit in 4 to 5 weeks for repeat examination and likely repeat intravitreal in Avastin OD  3.  Clinical review of potential outcomes undertaken.  I used the house fire analogy to explain this condition and its treatment  Ophthalmic Meds Ordered this visit:  Meds ordered this encounter  Medications  . Bevacizumab (AVASTIN) SOLN 1.25 mg       Return in about 5 weeks (around 12/23/2019) for dilate, OD, AVASTIN OCT.  There are no Patient Instructions on file for this visit.   Explained the diagnoses, plan, and follow up with the patient and they expressed understanding.  Patient expressed understanding of the importance of proper follow up care.   Clent Demark Jhoselyn Ruffini M.D. Diseases & Surgery of the Retina and Vitreous Retina & Diabetic [place] 11/18/19     Abbreviations: M myopia (nearsighted); A astigmatism; H hyperopia (farsighted); P presbyopia;  Mrx spectacle prescription;  CTL contact lenses; OD right eye; OS left eye; OU both eyes  XT exotropia; ET esotropia; PEK punctate epithelial keratitis; PEE punctate epithelial erosions; DES dry eye syndrome; MGD meibomian gland dysfunction; ATs artificial tears; PFAT's preservative free artificial tears; [place] nuclear sclerotic cataract; PSC posterior subcapsular cataract; ERM epi-retinal membrane; PVD posterior vitreous detachment; RD retinal detachment; DM diabetes mellitus; DR diabetic retinopathy; NPDR non-proliferative diabetic retinopathy; PDR proliferative diabetic retinopathy; CSME clinically significant macular edema; DME diabetic macular edema; dbh dot blot hemorrhages; CWS cotton wool spot; POAG primary open angle glaucoma; C/D cup-to-disc ratio; HVF humphrey visual field; GVF goldmann visual field; OCT optical coherence tomography; IOP intraocular pressure; BRVO Branch retinal vein occlusion; CRVO central retinal vein occlusion; CRAO central retinal artery occlusion; BRAO branch retinal artery occlusion; RT retinal tear; SB scleral buckle; PPV pars plana vitrectomy; VH Vitreous hemorrhage; PRP panretinal laser photocoagulation; IVK intravitreal kenalog; VMT vitreomacular traction; MH Macular hole;  NVD neovascularization of the disc; NVE neovascularization elsewhere; AREDS age related eye disease study; ARMD age related macular degeneration; POAG primary open angle glaucoma; EBMD epithelial/anterior basement membrane dystrophy; ACIOL anterior chamber intraocular lens; IOL intraocular lens; PCIOL posterior chamber intraocular lens; Phaco/IOL phacoemulsification with intraocular lens placement; [place] photorefractive keratectomy; LASIK laser assisted in situ keratomileusis; HTN hypertension; DM diabetes mellitus; COPD chronic obstructive pulmonary disease

## 2019-12-23 ENCOUNTER — Encounter (INDEPENDENT_AMBULATORY_CARE_PROVIDER_SITE_OTHER): Payer: BC Managed Care – PPO | Admitting: Ophthalmology

## 2019-12-23 ENCOUNTER — Ambulatory Visit (INDEPENDENT_AMBULATORY_CARE_PROVIDER_SITE_OTHER): Payer: BC Managed Care – PPO | Admitting: Ophthalmology

## 2019-12-23 ENCOUNTER — Other Ambulatory Visit: Payer: Self-pay

## 2019-12-23 ENCOUNTER — Encounter (INDEPENDENT_AMBULATORY_CARE_PROVIDER_SITE_OTHER): Payer: Self-pay | Admitting: Ophthalmology

## 2019-12-23 DIAGNOSIS — H353211 Exudative age-related macular degeneration, right eye, with active choroidal neovascularization: Secondary | ICD-10-CM | POA: Diagnosis not present

## 2019-12-23 MED ORDER — BEVACIZUMAB CHEMO INJECTION 1.25MG/0.05ML SYRINGE FOR KALEIDOSCOPE
1.2500 mg | INTRAVITREAL | Status: AC | PRN
  Administered 2019-12-23: 1.25 mg via INTRAVITREAL

## 2019-12-23 NOTE — Progress Notes (Signed)
12/23/2019     CHIEF COMPLAINT Patient presents for Retina Follow Up   HISTORY OF PRESENT ILLNESS: Logan Mora is a 44 y.o. 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  Severity is moderate.  Duration of 5 weeks.  Since onset it is stable.  I, the attending physician,  performed the HPI with the patient and updated documentation appropriately.          Comments    5 Week f\u OD. Possible Avastin OD. OCT  PT states vision has improved in OD. Denies any complaints       Last edited by Tilda Franco on 12/23/2019  8:25 AM. (History)      Referring physician: Merrilee Seashore, MD [address] [place],  [place] [address]  HISTORICAL INFORMATION:   Selected notes from the [facility]: No current outpatient medications on file. (Ophthalmic Drugs)   No current facility-administered medications for this visit. (Ophthalmic Drugs)   Current Outpatient Medications (Other)  Medication Sig  . budesonide (RHINOCORT ALLERGY) 32 MCG/ACT nasal spray Place 1 spray into both nostrils daily.  . Fluticasone Furoate (ARNUITY ELLIPTA) 200 MCG/ACT AEPB Inhale 1 Dose into the lungs daily. Rinse, gargle, and spit after use.  . montelukast (SINGULAIR) 10 MG tablet Take 10 mg by mouth daily.   Marland Kitchen PROAIR HFA 108 (90 Base) MCG/ACT inhaler Inhale two puffs every four to six hours as needed for cough or wheeze.   No current facility-administered medications for this visit. (Other)      REVIEW OF SYSTEMS:    ALLERGIES Allergies  Allergen Reactions  . Fluvirin [Influenza Vac Split Quad] Shortness Of Breath    Gets the Flu, body aches    PAST MEDICAL HISTORY Past Medical History:  Diagnosis Date  . Asthma   . COPD (chronic obstructive pulmonary disease) ([place])   . Tobacco abuse    Past Surgical History:  Procedure Laterality Date  . DENTAL SURGERY    .  DENTAL SURGERY     Dental Implants    FAMILY HISTORY Family History  Problem Relation Age of Onset  . Bone cancer Maternal Grandmother   . Heart disease Maternal Grandfather   . Heart disease Paternal Grandfather     SOCIAL HISTORY Social History   Tobacco Use  . Smoking status: Former Smoker    Packs/day: 0.25    Types: Cigarettes  . Smokeless tobacco: Never Used  Substance Use Topics  . Alcohol use: Yes    Comment: rarely  . Drug use: No         OPHTHALMIC EXAM:  Base Eye Exam    Visual Acuity (Snellen - Linear)      Right Left   Dist [place] 20/25 + 20/20       Tonometry (Tonopen, 8:28 AM)      Right Left   Pressure 13 10       Pupils      Pupils Dark Light Shape React APD   Right PERRL 4 3 Round Brisk None   Left PERRL 4 3 Round Brisk None       Visual Fields (Counting fingers)      Left Right    Full Full       Neuro/Psych    Oriented x3: Yes   Mood/Affect: Normal  Dilation    Both eyes: 1.0% Mydriacyl, 2.5% Phenylephrine @ 8:28 AM        Slit Lamp and Fundus Exam    External Exam      Right Left   External Normal Normal       Slit Lamp Exam      Right Left   Lids/Lashes Normal Normal   Conjunctiva/Sclera White and quiet White and quiet   Cornea Clear Clear   Anterior Chamber Deep and quiet Deep and quiet   Iris Round and reactive Round and reactive   Lens Clear Clear   Anterior Vitreous Normal Normal       Fundus Exam      Right Left   Posterior Vitreous Normal    Disc Normal    C/D Ratio 0.3    Macula Soft drusen, subretinal membrane, Retinal pigment epithelial mottling, Early age related macular degeneration, Retinal pigment epithelial atrophy, much less subretinal fluid, no retinal hemorrhage    Vessels Normal    Periphery Normal no pisciform flecks seen in periphery           IMAGING AND PROCEDURES  Imaging and Procedures for 12/23/19  OCT, Retina - OU - Both Eyes       Right Eye Quality was good. Scan  locations included subfoveal. Central Foveal Thickness: 255. Progression has improved. Findings include no SRF, disciform scar, retinal drusen , outer retinal atrophy.   Left Eye Quality was good. Scan locations included subfoveal. Central Foveal Thickness: 265. Progression has been stable.   Notes OD, much improved 5 weeks status post Avastin No. 1 for subretinal fluid overlying a large drusenoid deposit obviously vascularized.  Now no subretinal fluid remains       Intravitreal Injection, Pharmacologic Agent - OD - Right Eye       Time Out 12/23/2019. 9:40 AM. Confirmed correct patient, procedure, site, and patient consented.   Anesthesia Topical anesthesia was used. Anesthetic medications included Akten 3.5%.   Procedure Preparation included Ofloxacin , 10% betadine to eyelids, 5% betadine to ocular surface. A 30 gauge needle was used.   Injection:  1.25 mg Bevacizumab (AVASTIN) SOLN   NDC: [address]-5, Lot: [id number]   Route: Intravitreal, Site: Right Eye, Waste: 0 mg  Post-op Post injection exam found visual acuity of at least counting fingers. The patient tolerated the procedure well. There were no complications. The patient received written and verbal post procedure care education. Post injection medications were not given.                 ASSESSMENT/PLAN:  Exudative age-related macular degeneration of right eye with active choroidal neovascularization (HCC) Improved macular anatomy, and vision, less subretinal fluid, at 5-week interval post intravitreal Avastin.  Repeat injection Avastin OD today and examination in 6 weeks      ICD-10-CM   1. Exudative age-related macular degeneration of right eye with active choroidal neovascularization (HCC)  H35.3211 OCT, Retina - OU - Both Eyes    Intravitreal Injection, Pharmacologic Agent - OD - Right Eye    Bevacizumab (AVASTIN) SOLN 1.25 mg    1.  Repeat injection intravitreal Avastin today, improved post recent  injection  2.  3.  Ophthalmic Meds Ordered this visit:  Meds ordered this encounter  Medications  . Bevacizumab (AVASTIN) SOLN 1.25 mg       Return in about 6 weeks (around 02/03/2020) for dilate, OD, AVASTIN OCT.  Patient Instructions  Patient to contact the office promptly should new difficulties arise  such as distorted vision or decline in vision in either eye    Explained the diagnoses, plan, and follow up with the patient and they expressed understanding.  Patient expressed understanding of the importance of proper follow up care.   Clent Demark Alesi Zachery M.D. Diseases & Surgery of the Retina and Vitreous Retina & Diabetic [place] 12/23/19     Abbreviations: M myopia (nearsighted); A astigmatism; H hyperopia (farsighted); P presbyopia; Mrx spectacle prescription;  CTL contact lenses; OD right eye; OS left eye; OU both eyes  XT exotropia; ET esotropia; PEK punctate epithelial keratitis; PEE punctate epithelial erosions; DES dry eye syndrome; MGD meibomian gland dysfunction; ATs artificial tears; PFAT's preservative free artificial tears; [place] nuclear sclerotic cataract; PSC posterior subcapsular cataract; ERM epi-retinal membrane; PVD posterior vitreous detachment; RD retinal detachment; DM diabetes mellitus; DR diabetic retinopathy; NPDR non-proliferative diabetic retinopathy; PDR proliferative diabetic retinopathy; CSME clinically significant macular edema; DME diabetic macular edema; dbh dot blot hemorrhages; CWS cotton wool spot; POAG primary open angle glaucoma; C/D cup-to-disc ratio; HVF humphrey visual field; GVF goldmann visual field; OCT optical coherence tomography; IOP intraocular pressure; BRVO Branch retinal vein occlusion; CRVO central retinal vein occlusion; CRAO central retinal artery occlusion; BRAO branch retinal artery occlusion; RT retinal tear; SB scleral buckle; PPV pars plana vitrectomy; VH Vitreous hemorrhage; PRP panretinal laser photocoagulation; IVK  intravitreal kenalog; VMT vitreomacular traction; MH Macular hole;  NVD neovascularization of the disc; NVE neovascularization elsewhere; AREDS age related eye disease study; ARMD age related macular degeneration; POAG primary open angle glaucoma; EBMD epithelial/anterior basement membrane dystrophy; ACIOL anterior chamber intraocular lens; IOL intraocular lens; PCIOL posterior chamber intraocular lens; Phaco/IOL phacoemulsification with intraocular lens placement; [place] photorefractive keratectomy; LASIK laser assisted in situ keratomileusis; HTN hypertension; DM diabetes mellitus; COPD chronic obstructive pulmonary disease

## 2019-12-23 NOTE — Assessment & Plan Note (Signed)
Improved macular anatomy, and vision, less subretinal fluid, at 5-week interval post intravitreal Avastin.  Repeat injection Avastin OD today and examination in 6 weeks

## 2019-12-23 NOTE — Patient Instructions (Signed)
Patient to contact the office promptly should new difficulties arise such as distorted vision or decline in vision in either eye

## 2019-12-25 ENCOUNTER — Encounter (INDEPENDENT_AMBULATORY_CARE_PROVIDER_SITE_OTHER): Payer: BC Managed Care – PPO | Admitting: Ophthalmology

## 2020-02-03 ENCOUNTER — Encounter (INDEPENDENT_AMBULATORY_CARE_PROVIDER_SITE_OTHER): Payer: BC Managed Care – PPO | Admitting: Ophthalmology

## 2020-02-09 ENCOUNTER — Ambulatory Visit (INDEPENDENT_AMBULATORY_CARE_PROVIDER_SITE_OTHER): Payer: BC Managed Care – PPO | Admitting: Ophthalmology

## 2020-02-09 ENCOUNTER — Other Ambulatory Visit: Payer: Self-pay

## 2020-02-09 ENCOUNTER — Encounter (INDEPENDENT_AMBULATORY_CARE_PROVIDER_SITE_OTHER): Payer: Self-pay | Admitting: Ophthalmology

## 2020-02-09 DIAGNOSIS — H353211 Exudative age-related macular degeneration, right eye, with active choroidal neovascularization: Secondary | ICD-10-CM | POA: Diagnosis not present

## 2020-02-09 MED ORDER — BEVACIZUMAB CHEMO INJECTION 1.25MG/0.05ML SYRINGE FOR KALEIDOSCOPE
1.2500 mg | INTRAVITREAL | Status: AC | PRN
  Administered 2020-02-09: 1.25 mg via INTRAVITREAL

## 2020-02-09 NOTE — Patient Instructions (Signed)
Patient asked to report promptly new onset visual acuity distortions or declines in either eye.

## 2020-02-09 NOTE — Assessment & Plan Note (Signed)
Active CNVM OD, vastly improved since onset of therapy March 2021.  We will repeat injection today at 7-week interval and schedule repeat examination in 8 weeks.

## 2020-02-09 NOTE — Progress Notes (Addendum)
02/09/2020     CHIEF COMPLAINT Patient presents for Retina Follow Up   HISTORY OF PRESENT ILLNESS: Logan Mora is a 44 y.o. male who presents to the clinic today for:   HPI    Retina Follow Up    Patient presents with  Wet AMD.  In right eye.  This started 7 weeks ago.  Severity is mild.  Duration of 7 weeks.  Since onset it is gradually improving.          Comments    7 Week AMD F/U OD, poss Avastin OD  Pt reports improved VA OD. Pt sts round dark circle in [place] OD is completely gone. VA stable OS.       Last edited by [name], [place] on 02/09/2020  3:24 PM. (History)      Referring physician: Merrilee Seashore, MD [address] [place],  [place] [id number]  HISTORICAL INFORMATION:   Selected notes from the [facility]: No current outpatient medications on file. (Ophthalmic Drugs)   No current facility-administered medications for this visit. (Ophthalmic Drugs)   Current Outpatient Medications (Other)  Medication Sig  . budesonide (RHINOCORT ALLERGY) 32 MCG/ACT nasal spray Place 1 spray into both nostrils daily.  . Fluticasone Furoate (ARNUITY ELLIPTA) 200 MCG/ACT AEPB Inhale 1 Dose into the lungs daily. Rinse, gargle, and spit after use.  . montelukast (SINGULAIR) 10 MG tablet Take 10 mg by mouth daily.   Marland Kitchen PROAIR HFA 108 (90 Base) MCG/ACT inhaler Inhale two puffs every four to six hours as needed for cough or wheeze.   No current facility-administered medications for this visit. (Other)      REVIEW OF SYSTEMS:    ALLERGIES Allergies  Allergen Reactions  . Fluvirin [Influenza Vac Split Quad] Shortness Of Breath    Gets the Flu, body aches    PAST MEDICAL HISTORY Past Medical History:  Diagnosis Date  . Asthma   . COPD (chronic obstructive pulmonary disease) ([place])   . Tobacco abuse    Past Surgical History:  Procedure Laterality Date  . DENTAL SURGERY    . DENTAL SURGERY     Dental  Implants    FAMILY HISTORY Family History  Problem Relation Age of Onset  . Bone cancer Maternal Grandmother   . Heart disease Maternal Grandfather   . Heart disease Paternal Grandfather     SOCIAL HISTORY Social History   Tobacco Use  . Smoking status: Former Smoker    Packs/day: 0.25    Types: Cigarettes  . Smokeless tobacco: Never Used  Substance Use Topics  . Alcohol use: Yes    Comment: rarely  . Drug use: No         OPHTHALMIC EXAM:  Base Eye Exam    Visual Acuity (ETDRS)      Right Left   Dist [place] 20/20 20/20       Tonometry (Tonopen, 3:25 PM)      Right Left   Pressure 13 18       Pupils      Pupils Dark Light Shape React APD   Right PERRL 4 3 Round Brisk None   Left PERRL 4 3 Round Brisk None       Visual Fields (Counting fingers)      Left Right    Full Full       Extraocular Movement      Right Left    Full Full  Neuro/Psych    Oriented x3: Yes   Mood/Affect: Normal       Dilation    Right eye: 1.0% Mydriacyl, 2.5% Phenylephrine @ 3:27 PM        Slit Lamp and Fundus Exam    External Exam      Right Left   External Normal Normal       Slit Lamp Exam      Right Left   Lids/Lashes Normal Normal   Conjunctiva/Sclera White and quiet White and quiet   Cornea Clear Clear   Anterior Chamber Deep and quiet Deep and quiet   Iris Round and reactive Round and reactive   Lens Clear Clear   Anterior Vitreous Normal Normal       Fundus Exam      Right Left   Posterior Vitreous Normal    Disc Normal    C/D Ratio 0.3    Macula Soft drusen, Retinal pigment epithelial mottling, Early age related macular degeneration, Retinal pigment epithelial atrophy, much less subretinal fluid, no retinal hemorrhage, Disciform scar    Vessels Normal    Periphery Normal no pisciform flecks seen in periphery           IMAGING AND PROCEDURES  Imaging and Procedures for 02/09/20  OCT, Retina - OU - Both Eyes       Right Eye Quality was  good. Scan locations included subfoveal. Central Foveal Thickness: 253. Progression has improved. Findings include retinal drusen , abnormal foveal contour, disciform scar.   Left Eye Quality was good. Scan locations included subfoveal. Central Foveal Thickness: 259. Progression has been stable.   Notes OD, vastly improved since onset of therapy July 2021, repeat injection OD today and evaluation in 8 weeks       Intravitreal Injection, Pharmacologic Agent - OD - Right Eye       Time Out 02/09/2020. 4:51 PM. Confirmed correct patient, procedure, site, and patient consented.   Anesthesia Topical anesthesia was used. Anesthetic medications included Akten 3.5%.   Procedure Preparation included Ofloxacin , 10% betadine to eyelids, 5% betadine to ocular surface. A 30 gauge needle was used.   Injection:  1.25 mg Bevacizumab (AVASTIN) SOLN   NDC: 70360-001-02, Lot: [id number]   Route: Intravitreal, Site: Right Eye, Waste: 0 mg  Post-op Post injection exam found visual acuity of at least counting fingers. The patient tolerated the procedure well. There were no complications. The patient received written and verbal post procedure care education. Post injection medications were not given.                 ASSESSMENT/PLAN:  Exudative age-related macular degeneration of right eye with active choroidal neovascularization (HCC) Active CNVM OD, vastly improved since onset of therapy March 2021.  We will repeat injection today at 7-week interval and schedule repeat examination in 8 weeks.      ICD-10-CM   1. Exudative age-related macular degeneration of right eye with active choroidal neovascularization (HCC)  H35.3211 OCT, Retina - OU - Both Eyes    Intravitreal Injection, Pharmacologic Agent - OD - Right Eye    Bevacizumab (AVASTIN) SOLN 1.25 mg    1.  Repeat injection intravitreal Avastin OD today and examination repeated in 8 weeks OD  2.  3.  Ophthalmic Meds Ordered this  visit:  Meds ordered this encounter  Medications  . Bevacizumab (AVASTIN) SOLN 1.25 mg       Return in 8 weeks (on 04/05/2020) for dilate, OD, AVASTIN OCT.  Patient  Instructions  Patient asked to report promptly new onset visual acuity distortions or declines in either eye.    Explained the diagnoses, plan, and follow up with the patient and they expressed understanding.  Patient expressed understanding of the importance of proper follow up care.   Clent Demark Chaselyn Nanney M.D. Diseases & Surgery of the Retina and Vitreous Retina & Diabetic [place] 02/09/20     Abbreviations: M myopia (nearsighted); A astigmatism; H hyperopia (farsighted); P presbyopia; Mrx spectacle prescription;  CTL contact lenses; OD right eye; OS left eye; OU both eyes  XT exotropia; ET esotropia; PEK punctate epithelial keratitis; PEE punctate epithelial erosions; DES dry eye syndrome; MGD meibomian gland dysfunction; ATs artificial tears; PFAT's preservative free artificial tears; [place] nuclear sclerotic cataract; PSC posterior subcapsular cataract; ERM epi-retinal membrane; PVD posterior vitreous detachment; RD retinal detachment; DM diabetes mellitus; DR diabetic retinopathy; NPDR non-proliferative diabetic retinopathy; PDR proliferative diabetic retinopathy; CSME clinically significant macular edema; DME diabetic macular edema; dbh dot blot hemorrhages; CWS cotton wool spot; POAG primary open angle glaucoma; C/D cup-to-disc ratio; HVF humphrey visual field; GVF goldmann visual field; OCT optical coherence tomography; IOP intraocular pressure; BRVO Branch retinal vein occlusion; CRVO central retinal vein occlusion; CRAO central retinal artery occlusion; BRAO branch retinal artery occlusion; RT retinal tear; SB scleral buckle; PPV pars plana vitrectomy; VH Vitreous hemorrhage; PRP panretinal laser photocoagulation; IVK intravitreal kenalog; VMT vitreomacular traction; MH Macular hole;  NVD neovascularization of the disc; NVE  neovascularization elsewhere; AREDS age related eye disease study; ARMD age related macular degeneration; POAG primary open angle glaucoma; EBMD epithelial/anterior basement membrane dystrophy; ACIOL anterior chamber intraocular lens; IOL intraocular lens; PCIOL posterior chamber intraocular lens; Phaco/IOL phacoemulsification with intraocular lens placement; [place] photorefractive keratectomy; LASIK laser assisted in situ keratomileusis; HTN hypertension; DM diabetes mellitus; COPD chronic obstructive pulmonary disease

## 2020-04-06 ENCOUNTER — Encounter (INDEPENDENT_AMBULATORY_CARE_PROVIDER_SITE_OTHER): Payer: BC Managed Care – PPO | Admitting: Ophthalmology

## 2020-04-06 ENCOUNTER — Ambulatory Visit (INDEPENDENT_AMBULATORY_CARE_PROVIDER_SITE_OTHER): Payer: BC Managed Care – PPO | Admitting: Ophthalmology

## 2020-04-06 ENCOUNTER — Other Ambulatory Visit: Payer: Self-pay

## 2020-04-06 ENCOUNTER — Encounter (INDEPENDENT_AMBULATORY_CARE_PROVIDER_SITE_OTHER): Payer: Self-pay | Admitting: Ophthalmology

## 2020-04-06 DIAGNOSIS — H353211 Exudative age-related macular degeneration, right eye, with active choroidal neovascularization: Secondary | ICD-10-CM

## 2020-04-06 MED ORDER — BEVACIZUMAB CHEMO INJECTION 1.25MG/0.05ML SYRINGE FOR KALEIDOSCOPE
1.2500 mg | INTRAVITREAL | Status: AC | PRN
  Administered 2020-04-06: 1.25 mg via INTRAVITREAL

## 2020-04-06 NOTE — Patient Instructions (Signed)
Patient instructed to promptly report new onset visual acuity decline or distortions in either eye

## 2020-04-06 NOTE — Progress Notes (Signed)
04/06/2020     CHIEF COMPLAINT Patient presents for Retina Follow Up   HISTORY OF PRESENT ILLNESS: Logan Mora is a 44 y.o. male who presents to the clinic today for:   HPI    Retina Follow Up    Patient presents with  Wet AMD.  In right eye.  This started 8 weeks ago.  Severity is mild.  Duration of 8 weeks.  Since onset it is stable.          Comments    8 WK F/U OD, POSS AVASTIN OD    Pt reports stable vision, no new F/F, no pain or pressure.        Last edited by Nichola Sizer D on 04/06/2020  3:33 PM. (History)      Referring physician: Merrilee Seashore, MD [place] [place],  [place] [address]  HISTORICAL INFORMATION:   Selected notes from the [facility]: No current outpatient medications on file. (Ophthalmic Drugs)   No current facility-administered medications for this visit. (Ophthalmic Drugs)   Current Outpatient Medications (Other)  Medication Sig  . budesonide (RHINOCORT ALLERGY) 32 MCG/ACT nasal spray Place 1 spray into both nostrils daily.  . Fluticasone Furoate (ARNUITY ELLIPTA) 200 MCG/ACT AEPB Inhale 1 Dose into the lungs daily. Rinse, gargle, and spit after use.  . montelukast (SINGULAIR) 10 MG tablet Take 10 mg by mouth daily.   Marland Kitchen PROAIR HFA 108 (90 Base) MCG/ACT inhaler Inhale two puffs every four to six hours as needed for cough or wheeze.   No current facility-administered medications for this visit. (Other)      REVIEW OF SYSTEMS:    ALLERGIES Allergies  Allergen Reactions  . Fluvirin [Influenza Vac Split Quad] Shortness Of Breath    Gets the Flu, body aches    PAST MEDICAL HISTORY Past Medical History:  Diagnosis Date  . Asthma   . COPD (chronic obstructive pulmonary disease) ([place])   . Tobacco abuse    Past Surgical History:  Procedure Laterality Date  . DENTAL SURGERY    . DENTAL SURGERY     Dental Implants    FAMILY HISTORY Family History    Problem Relation Age of Onset  . Bone cancer Maternal Grandmother   . Heart disease Maternal Grandfather   . Heart disease Paternal Grandfather     SOCIAL HISTORY Social History   Tobacco Use  . Smoking status: Former Smoker    Packs/day: 0.25    Types: Cigarettes  . Smokeless tobacco: Never Used  Substance Use Topics  . Alcohol use: Yes    Comment: rarely  . Drug use: No         OPHTHALMIC EXAM: Base Eye Exam    Visual Acuity (ETDRS)      Right Left   Dist [place] 20/25 +2 20/20 -1       Tonometry (Tonopen, 3:38 PM)      Right Left   Pressure 17 10       Pupils      Pupils Dark Light Shape React APD   Right PERRL 4 3 Round Brisk None   Left PERRL 4 3 Round Brisk None       Visual Fields (Counting fingers)      Left Right    Full Full       Extraocular Movement      Right Left    Full Full  Neuro/Psych    Oriented x3: Yes   Mood/Affect: Normal       Dilation    Right eye: 1.0% Mydriacyl, 2.5% Phenylephrine @ 3:38 PM        Slit Lamp and Fundus Exam    External Exam      Right Left   External Normal Normal       Slit Lamp Exam      Right Left   Lids/Lashes Normal Normal   Conjunctiva/Sclera White and quiet White and quiet   Cornea Clear Clear   Anterior Chamber Deep and quiet Deep and quiet   Iris Round and reactive Round and reactive   Lens Clear Clear   Anterior Vitreous Normal Normal       Fundus Exam      Right Left   Posterior Vitreous Normal    Disc Normal    C/D Ratio 0.3    Macula Soft drusen, Retinal pigment epithelial mottling, Early age related macular degeneration, Retinal pigment epithelial atrophy, much less subretinal fluid, no retinal hemorrhage, Disciform scar    Vessels Normal    Periphery Normal no pisciform flecks seen in periphery           IMAGING AND PROCEDURES  Imaging and Procedures for 04/06/20  OCT, Retina - OU - Both Eyes       Right Eye Quality was good. Scan locations included subfoveal.  Central Foveal Thickness: 257. Progression has improved. Findings include abnormal foveal contour, disciform scar, no IRF, no SRF.   Left Eye Quality was good. Scan locations included subfoveal. Central Foveal Thickness: 267. Progression has been stable. Findings include abnormal foveal contour, no IRF, no SRF.   Notes Much less active subfoveal CNVM and fibrosis, no subretinal fluid remains currently at 8-week follow-up.  Repeat injection OD today and examination in 9 weeks       Intravitreal Injection, Pharmacologic Agent - OD - Right Eye       Time Out 04/06/2020. 4:43 PM. Confirmed correct patient, procedure, site, and patient consented.   Anesthesia Topical anesthesia was used. Anesthetic medications included Akten 3.5%.   Procedure Preparation included Ofloxacin , 10% betadine to eyelids, 5% betadine to ocular surface, Tobramycin 0.3%. A 30 gauge needle was used.   Injection:  1.25 mg Bevacizumab (AVASTIN) SOLN   NDC: 70360-001-02, Lot: [id number]   Route: Intravitreal, Site: Right Eye, Waste: 0 mg  Post-op Post injection exam found visual acuity of at least counting fingers. The patient tolerated the procedure well. There were no complications. The patient received written and verbal post procedure care education. Post injection medications were not given.                 ASSESSMENT/PLAN:  Exudative age-related macular degeneration of right eye with active choroidal neovascularization (HCC) The nature of wet macular degeneration was discussed with the patient.  Forms of therapy reviewed include the use of Anti-VEGF medications injected painlessly into the eye, as well as other possible treatment modalities, including thermal laser therapy. Fellow eye involvement and risks were discussed with the patient. Upon the finding of wet age related macular degeneration, treatment will be offered. The treatment regimen is on a treat as needed basis with the intent to treat if  necessary and extend interval of exams when possible. On average 1 out of 6 patients do not need lifetime therapy. However, the risk of recurrent disease is high for a lifetime.  Initially monthly, then periodic, examinations and evaluations will determine whether  the next treatment is required on the day of the examination.  OD, treat and extend currently at 8-week interval, repeat injection today and follow-up in 9 weeks      ICD-10-CM   1. Exudative age-related macular degeneration of right eye with active choroidal neovascularization (HCC)  H35.3211 OCT, Retina - OU - Both Eyes    Intravitreal Injection, Pharmacologic Agent - OD - Right Eye    Bevacizumab (AVASTIN) SOLN 1.25 mg    1.  Vastly improved anatomy and visual acuity as well OD with use of antivegF for CNVM.  Currently at 8-week interval OD, will repeat today and examination again in 9 weeks using a treat and extension of the evaluation interval  2.  3.  Ophthalmic Meds Ordered this visit:  Meds ordered this encounter  Medications  . Bevacizumab (AVASTIN) SOLN 1.25 mg       Return in about 9 weeks (around 06/08/2020) for dilate, OD, AVASTIN OCT.  There are no Patient Instructions on file for this visit.   Explained the diagnoses, plan, and follow up with the patient and they expressed understanding.  Patient expressed understanding of the importance of proper follow up care.   Clent Demark Pepe Mineau M.D. Diseases & Surgery of the Retina and Vitreous [facility] 04/06/20     Abbreviations: M myopia (nearsighted); A astigmatism; H hyperopia (farsighted); P presbyopia; Mrx spectacle prescription;  CTL contact lenses; OD right eye; OS left eye; OU both eyes  XT exotropia; ET esotropia; PEK punctate epithelial keratitis; PEE punctate epithelial erosions; DES dry eye syndrome; MGD meibomian gland dysfunction; ATs artificial tears; PFAT's preservative free artificial tears; [place] nuclear sclerotic cataract; PSC  posterior subcapsular cataract; ERM epi-retinal membrane; PVD posterior vitreous detachment; RD retinal detachment; DM diabetes mellitus; DR diabetic retinopathy; NPDR non-proliferative diabetic retinopathy; PDR proliferative diabetic retinopathy; CSME clinically significant macular edema; DME diabetic macular edema; dbh dot blot hemorrhages; CWS cotton wool spot; POAG primary open angle glaucoma; C/D cup-to-disc ratio; HVF humphrey visual field; GVF goldmann visual field; OCT optical coherence tomography; IOP intraocular pressure; BRVO Branch retinal vein occlusion; CRVO central retinal vein occlusion; CRAO central retinal artery occlusion; BRAO branch retinal artery occlusion; RT retinal tear; SB scleral buckle; PPV pars plana vitrectomy; VH Vitreous hemorrhage; PRP panretinal laser photocoagulation; IVK intravitreal kenalog; VMT vitreomacular traction; MH Macular hole;  NVD neovascularization of the disc; NVE neovascularization elsewhere; AREDS age related eye disease study; ARMD age related macular degeneration; POAG primary open angle glaucoma; EBMD epithelial/anterior basement membrane dystrophy; ACIOL anterior chamber intraocular lens; IOL intraocular lens; PCIOL posterior chamber intraocular lens; Phaco/IOL phacoemulsification with intraocular lens placement; [place] photorefractive keratectomy; LASIK laser assisted in situ keratomileusis; HTN hypertension; DM diabetes mellitus; COPD chronic obstructive pulmonary disease

## 2020-04-06 NOTE — Assessment & Plan Note (Signed)
The nature of wet macular degeneration was discussed with the patient.  Forms of therapy reviewed include the use of Anti-VEGF medications injected painlessly into the eye, as well as other possible treatment modalities, including thermal laser therapy. Fellow eye involvement and risks were discussed with the patient. Upon the finding of wet age related macular degeneration, treatment will be offered. The treatment regimen is on a treat as needed basis with the intent to treat if necessary and extend interval of exams when possible. On average 1 out of 6 patients do not need lifetime therapy. However, the risk of recurrent disease is high for a lifetime.  Initially monthly, then periodic, examinations and evaluations will determine whether the next treatment is required on the day of the examination.  OD, treat and extend currently at 8-week interval, repeat injection today and follow-up in 9 weeks

## 2020-06-08 ENCOUNTER — Encounter (INDEPENDENT_AMBULATORY_CARE_PROVIDER_SITE_OTHER): Payer: Self-pay | Admitting: Ophthalmology

## 2020-06-08 ENCOUNTER — Ambulatory Visit (INDEPENDENT_AMBULATORY_CARE_PROVIDER_SITE_OTHER): Payer: BC Managed Care – PPO | Admitting: Ophthalmology

## 2020-06-08 ENCOUNTER — Other Ambulatory Visit: Payer: Self-pay

## 2020-06-08 DIAGNOSIS — H353211 Exudative age-related macular degeneration, right eye, with active choroidal neovascularization: Secondary | ICD-10-CM | POA: Diagnosis not present

## 2020-06-08 DIAGNOSIS — H353132 Nonexudative age-related macular degeneration, bilateral, intermediate dry stage: Secondary | ICD-10-CM

## 2020-06-08 MED ORDER — BEVACIZUMAB 2.5 MG/0.1ML IZ SOSY
2.5000 mg | PREFILLED_SYRINGE | INTRAVITREAL | Status: AC | PRN
  Administered 2020-06-08: 2.5 mg via INTRAVITREAL

## 2020-06-08 NOTE — Assessment & Plan Note (Signed)
No signs of CNVM OS by OCT evaluation today

## 2020-06-08 NOTE — Assessment & Plan Note (Signed)
History of CNVM with multiple recurrences, now stabilized on intravitreal Avastin currently 9-week follow-up.  We will repeat injection today to main quiescent's of disease and extend interval of examination now to 3 months

## 2020-06-08 NOTE — Patient Instructions (Signed)
Patient instructed to contact the office promptly for new onset visual acuity declines or distortions

## 2020-06-08 NOTE — Progress Notes (Signed)
06/08/2020     CHIEF COMPLAINT Patient presents for Retina Follow Up (9 Week Wet AMD f\u OD. Possible Avastin OD. OCT/Pt states vision is doing well, possibly better. Denies complaints.)   HISTORY OF PRESENT ILLNESS: Logan Mora is a 45 y.o. male who presents to the clinic today for:   HPI    Retina Follow Up    Patient presents with  Wet AMD.  In right eye.  Severity is moderate.  Duration of 9 weeks.  Since onset it is stable.  I, the attending physician,  performed the HPI with the patient and updated documentation appropriately. Additional comments: 9 Week Wet AMD f\u OD. Possible Avastin OD. OCT Pt states vision is doing well, possibly better. Denies complaints.       Last edited by Tilda Franco on 06/08/2020  3:29 PM. (History)      Referring physician: Merrilee Seashore, [place],  [place] [address]  HISTORICAL INFORMATION:   Selected notes from the [facility]: No current outpatient medications on file. (Ophthalmic Drugs)   No current facility-administered medications for this visit. (Ophthalmic Drugs)   Current Outpatient Medications (Other)  Medication Sig  . budesonide (RHINOCORT ALLERGY) 32 MCG/ACT nasal spray Place 1 spray into both nostrils daily.  . Fluticasone Furoate (ARNUITY ELLIPTA) 200 MCG/ACT AEPB Inhale 1 Dose into the lungs daily. Rinse, gargle, and spit after use.  . montelukast (SINGULAIR) 10 MG tablet Take 10 mg by mouth daily.   Marland Kitchen PROAIR HFA 108 (90 Base) MCG/ACT inhaler Inhale two puffs every four to six hours as needed for cough or wheeze.   No current facility-administered medications for this visit. (Other)      REVIEW OF SYSTEMS:    ALLERGIES Allergies  Allergen Reactions  . Fluvirin [Influenza Vac Split Quad] Shortness Of Breath    Gets the Flu, body aches    PAST MEDICAL HISTORY Past Medical History:  Diagnosis Date  . Asthma   . COPD (chronic  obstructive pulmonary disease) ([place])   . Tobacco abuse    Past Surgical History:  Procedure Laterality Date  . DENTAL SURGERY    . DENTAL SURGERY     Dental Implants    FAMILY HISTORY Family History  Problem Relation Age of Onset  . Bone cancer Maternal Grandmother   . Heart disease Maternal Grandfather   . Heart disease Paternal Grandfather     SOCIAL HISTORY Social History   Tobacco Use  . Smoking status: Former Smoker    Packs/day: 0.25    Types: Cigarettes  . Smokeless tobacco: Never Used  Substance Use Topics  . Alcohol use: Yes    Comment: rarely  . Drug use: No         OPHTHALMIC EXAM:  Base Eye Exam    Visual Acuity (Snellen - Linear)      Right Left   Dist [place] 20/20 -2 20/20       Tonometry (Tonopen, 3:32 PM)      Right Left   Pressure 17 14       Pupils      Pupils Dark Light Shape React APD   Right PERRL 4 3 Round Brisk None   Left PERRL 4 3 Round Brisk None       Visual Fields (Counting fingers)      Left Right    Full Full       Neuro/Psych  Oriented x3: Yes   Mood/Affect: Normal       Dilation    Right eye: 1.0% Mydriacyl, 2.5% Phenylephrine @ 3:32 PM        Slit Lamp and Fundus Exam    External Exam      Right Left   External Normal Normal       Slit Lamp Exam      Right Left   Lids/Lashes Normal Normal   Conjunctiva/Sclera White and quiet White and quiet   Cornea Clear Clear   Anterior Chamber Deep and quiet Deep and quiet   Iris Round and reactive Round and reactive   Lens Clear Clear   Anterior Vitreous Normal Normal       Fundus Exam      Right Left   Posterior Vitreous Normal    Disc Normal    C/D Ratio 0.3    Macula Soft drusen, Retinal pigment epithelial mottling, Early age related macular degeneration, Retinal pigment epithelial atrophy, much less subretinal fluid, no retinal hemorrhage, Disciform scar    Vessels Normal    Periphery Normal no pisciform flecks seen in periphery           IMAGING  AND PROCEDURES  Imaging and Procedures for 06/08/20  OCT, Retina - OU - Both Eyes       Right Eye Quality was good. Scan locations included subfoveal. Central Foveal Thickness: 260. Progression has improved. Findings include disciform scar, no IRF, no SRF, abnormal foveal contour.   Left Eye Quality was good. Scan locations included subfoveal. Central Foveal Thickness: 276. Progression has been stable. Findings include normal foveal contour, no SRF.   Notes No signs of active disease, controlled currently at 9-week follow-up with subfoveal disciform scar, no intra or subretinal fluid   Visible         Intravitreal Injection, Pharmacologic Agent - OD - Right Eye       Time Out 06/08/2020. 3:52 PM. Confirmed correct patient, procedure, site, and patient consented.   Anesthesia Topical anesthesia was used. Anesthetic medications included Akten 3.5%.   Procedure Preparation included Ofloxacin , 10% betadine to eyelids, 5% betadine to ocular surface, Tobramycin 0.3%. A 30 gauge needle was used.   Injection:  2.5 mg Bevacizumab (AVASTIN) 2.34m/0.1mL SOSY   NDC: 798921-194-17 Lot:: [id number]  Route: Intravitreal, Site: Right Eye  Post-op Post injection exam found visual acuity of at least counting fingers. The patient tolerated the procedure well. There were no complications. The patient received written and verbal post procedure care education. Post injection medications were not given.                 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  We will repeat injection today to main quiescent's of disease and extend interval of examination now to 3 months  Intermediate stage nonexudative age-related macular degeneration of both eyes No signs of CNVM OS by OCT evaluation today       ICD-10-CM   1. Exudative age-related macular degeneration of right eye with active choroidal neovascularization (HCC)  H35.3211 OCT, Retina - OU - Both Eyes    Intravitreal Injection, Pharmacologic Agent - OD - Right Eye    bevacizumab (AVASTIN) SOSY 2.5 mg  2. Intermediate stage nonexudative age-related macular degeneration of both eyes  H35.3132     1.  Vastly improved macular anatomy and preserve  visual functioning in the right eye, residual disciform scar and a juxta foveal location.  No signs of active disease today, at 9 weeks follow-up Avastin injection.  We will repeat injection today and follow-up examination next in 12 weeks  2.  3.  Ophthalmic Meds Ordered this visit:  Meds ordered this encounter  Medications  . bevacizumab (AVASTIN) SOSY 2.5 mg       Return in about 3 months (around 09/05/2020) for dilate, OD, AVASTIN OCT.  Patient Instructions  Patient instructed to contact the office promptly for new onset visual acuity declines or distortions    Explained the diagnoses, plan, and follow up with the patient and they expressed understanding.  Patient expressed understanding of the importance of proper follow up care.   Clent Demark Hubert Derstine M.D. Diseases & Surgery of the Retina and Vitreous [facility] 06/08/20     Abbreviations: M myopia (nearsighted); A astigmatism; H hyperopia (farsighted); P presbyopia; Mrx spectacle prescription;  CTL contact lenses; OD right eye; OS left eye; OU both eyes  XT exotropia; ET esotropia; PEK punctate epithelial keratitis; PEE punctate epithelial erosions; DES dry eye syndrome; MGD meibomian gland dysfunction; ATs artificial tears; PFAT's preservative free artificial tears; [place] nuclear sclerotic cataract; PSC posterior subcapsular cataract; ERM epi-retinal membrane; PVD posterior vitreous detachment; RD retinal detachment; DM diabetes mellitus; DR diabetic retinopathy; NPDR non-proliferative diabetic retinopathy; PDR  proliferative diabetic retinopathy; CSME clinically significant macular edema; DME diabetic macular edema; dbh dot blot hemorrhages; CWS cotton wool spot; POAG primary open angle glaucoma; C/D cup-to-disc ratio; HVF humphrey visual field; GVF goldmann visual field; OCT optical coherence tomography; IOP intraocular pressure; BRVO Branch retinal vein occlusion; CRVO central retinal vein occlusion; CRAO central retinal artery occlusion; BRAO branch retinal artery occlusion; RT retinal tear; SB scleral buckle; PPV pars plana vitrectomy; VH Vitreous hemorrhage; PRP panretinal laser photocoagulation; IVK intravitreal kenalog; VMT vitreomacular traction; MH Macular hole;  NVD neovascularization of the disc; NVE neovascularization elsewhere; AREDS age related eye disease study; ARMD age related macular degeneration; POAG primary open angle glaucoma; EBMD epithelial/anterior basement membrane dystrophy; ACIOL anterior chamber intraocular lens; IOL intraocular lens; PCIOL posterior chamber intraocular lens; Phaco/IOL phacoemulsification with intraocular lens placement; [place] photorefractive keratectomy; LASIK laser assisted in situ keratomileusis; HTN hypertension; DM diabetes mellitus; COPD chronic obstructive pulmonary disease

## 2020-06-28 ENCOUNTER — Ambulatory Visit (INDEPENDENT_AMBULATORY_CARE_PROVIDER_SITE_OTHER): Payer: BC Managed Care – PPO | Admitting: Ophthalmology

## 2020-06-28 ENCOUNTER — Other Ambulatory Visit: Payer: Self-pay

## 2020-06-28 ENCOUNTER — Encounter (INDEPENDENT_AMBULATORY_CARE_PROVIDER_SITE_OTHER): Payer: Self-pay | Admitting: Ophthalmology

## 2020-06-28 DIAGNOSIS — H353211 Exudative age-related macular degeneration, right eye, with active choroidal neovascularization: Secondary | ICD-10-CM

## 2020-06-28 DIAGNOSIS — H353132 Nonexudative age-related macular degeneration, bilateral, intermediate dry stage: Secondary | ICD-10-CM | POA: Diagnosis not present

## 2020-06-28 NOTE — Progress Notes (Signed)
06/28/2020     CHIEF COMPLAINT Patient presents for Retina Follow Up (Vision Loss OS ///Pt reports distorted vision and/or spots of vision loss OS. Pt denies any F/F, pain, or pressure OS. )   HISTORY OF PRESENT ILLNESS: Logan Mora is a 45 y.o. male who presents to the clinic today for:   HPI    Retina Follow Up    Patient presents with  Other.  In left eye.  This started 5 days ago.  Severity is moderate.  Duration of 5 days.  Since onset it is gradually worsening. Additional comments: Vision Loss OS    Pt reports distorted vision and/or spots of vision loss OS. Pt denies any F/F, pain, or pressure OS.        Last edited by Donne Hazel on 06/28/2020  8:38 AM. (History)      Referring physician: Merrilee Seashore, MD [address] [place],  [place] [address]  HISTORICAL INFORMATION:   Selected notes from the [place]: No current outpatient medications on file. (Ophthalmic Drugs)   No current facility-administered medications for this visit. (Ophthalmic Drugs)   Current Outpatient Medications (Other)  Medication Sig  . budesonide (RHINOCORT ALLERGY) 32 MCG/ACT nasal spray Place 1 spray into both nostrils daily.  . Fluticasone Furoate (ARNUITY ELLIPTA) 200 MCG/ACT AEPB Inhale 1 Dose into the lungs daily. Rinse, gargle, and spit after use.  . montelukast (SINGULAIR) 10 MG tablet Take 10 mg by mouth daily.   Marland Kitchen PROAIR HFA 108 (90 Base) MCG/ACT inhaler Inhale two puffs every four to six hours as needed for cough or wheeze.   No current facility-administered medications for this visit. (Other)      REVIEW OF SYSTEMS:    ALLERGIES Allergies  Allergen Reactions  . Fluvirin [Influenza Vac Split Quad] Shortness Of Breath    Gets the Flu, body aches    PAST MEDICAL HISTORY Past Medical History:  Diagnosis Date  . Asthma   . COPD (chronic obstructive pulmonary disease) ([place])   . Tobacco abuse     Past Surgical History:  Procedure Laterality Date  . DENTAL SURGERY    . DENTAL SURGERY     Dental Implants    FAMILY HISTORY Family History  Problem Relation Age of Onset  . Bone cancer Maternal Grandmother   . Heart disease Maternal Grandfather   . Heart disease Paternal Grandfather     SOCIAL HISTORY Social History   Tobacco Use  . Smoking status: Former Smoker    Packs/day: 0.25    Types: Cigarettes  . Smokeless tobacco: Never Used  Substance Use Topics  . Alcohol use: Yes    Comment: rarely  . Drug use: No         OPHTHALMIC EXAM:  Base Eye Exam    Visual Acuity (ETDRS)      Right Left   Dist [place] 20/20 -1 20/20       Tonometry (Tonopen, 8:43 AM)      Right Left   Pressure 16 15       Pupils      Pupils Dark Light Shape React APD   Right PERRL 4 3 Round Brisk None   Left PERRL 4 3 Round Brisk None       Visual Fields (Counting fingers)      Left Right    Full Full       Extraocular Movement  Right Left    Full Full       Neuro/Psych    Oriented x3: Yes   Mood/Affect: Normal       Dilation    Left eye: 1.0% Mydriacyl, 2.5% Phenylephrine @ 8:43 AM        Slit Lamp and Fundus Exam    External Exam      Right Left   External  Normal       Slit Lamp Exam      Right Left   Lids/Lashes  Normal   Conjunctiva/Sclera  White and quiet   Cornea  Clear   Anterior Chamber  Deep and quiet   Iris  Round and reactive   Lens  Clear   Anterior Vitreous  Normal       Fundus Exam      Right Left   Posterior Vitreous  Normal   Disc  Normal   C/D Ratio  0.4   Macula  Soft drusen, no macular thickening, no membrane, Retinal pigment epithelial mottling, Early age related macular degeneration, Retinal pigment epithelial atrophy   Vessels  Normal   Periphery  Normal no pisciform flecks seen in periphery          IMAGING AND PROCEDURES  Imaging and Procedures for 06/28/20  OCT, Retina - OU - Both Eyes       Right Eye Quality  was good. Scan locations included subfoveal. Central Foveal Thickness: 264. Progression has been stable. Findings include disciform scar, no IRF, no SRF, abnormal foveal contour.   Left Eye Quality was good. Scan locations included subfoveal. Central Foveal Thickness: 267. Progression has been stable. Findings include normal foveal contour, no SRF.   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  No signs of recurrence.  Right to follow-up as scheduled Sep 07, 2020  Intermediate stage nonexudative age-related macular degeneration of both eyes No signs of active CNVM, no subretinal fluid nor intraretinal fluid left eye by OCT evaluation.  Patient also reports that he has had a slight improvement in vision recently.  I did ask and inquire whether the patient may have review of systems for sleep apnea.  He does report having been told to snore and his sleeping has improved from change of position from supine to sleeping on his sides.  This further suggest that he may have some at least mild to moderate form of sleep apnea but he has self treated.  I have encouraged the patient to discover whether he has maximum oxygenation at nighttime and potentially use a nighttime continuous oximeter, well new or others of similar choice to reevaluate whether he might have a form of sleep apnea.  Should this nighttime oximetry disclose evidence of significant hypoxic events, more than 5/h, he should promptly seek medical evaluation and formal sleep testing      ICD-10-CM   1. Intermediate stage nonexudative age-related macular  degeneration of both eyes  H35.3132 OCT, Retina - OU - Both Eyes  2. Exudative age-related macular degeneration of right eye with active choroidal neovascularization ([place])  H35.3211  1.  Dilate OU next with OCT  2.  Possible Avastin OCT OD  3.  No new findings today on the left eye, no CNVM, however patient to report any new changes or profound changes in his vision  Ophthalmic Meds Ordered this visit:  No orders of the defined types were placed in this encounter.      Return for As scheduled, DILATE OU, AVASTIN OCT, OD.  There are no Patient Instructions on file for this visit.   Explained the diagnoses, plan, and follow up with the patient and they expressed understanding.  Patient expressed understanding of the importance of proper follow up care.   Clent Demark Dorotha Hirschi M.D. Diseases & Surgery of the Retina and Vitreous Retina & Diabetic [place] 06/28/20     Abbreviations: M myopia (nearsighted); A astigmatism; H hyperopia (farsighted); P presbyopia; Mrx spectacle prescription;  CTL contact lenses; OD right eye; OS left eye; OU both eyes  XT exotropia; ET esotropia; PEK punctate epithelial keratitis; PEE punctate epithelial erosions; DES dry eye syndrome; MGD meibomian gland dysfunction; ATs artificial tears; PFAT's preservative free artificial tears; [place] nuclear sclerotic cataract; PSC posterior subcapsular cataract; ERM epi-retinal membrane; PVD posterior vitreous detachment; RD retinal detachment; DM diabetes mellitus; DR diabetic retinopathy; NPDR non-proliferative diabetic retinopathy; PDR proliferative diabetic retinopathy; CSME clinically significant macular edema; DME diabetic macular edema; dbh dot blot hemorrhages; CWS cotton wool spot; POAG primary open angle glaucoma; C/D cup-to-disc ratio; HVF humphrey visual field; GVF goldmann visual field; OCT optical coherence tomography; IOP intraocular pressure; BRVO Branch retinal vein occlusion; CRVO central retinal vein  occlusion; CRAO central retinal artery occlusion; BRAO branch retinal artery occlusion; RT retinal tear; SB scleral buckle; PPV pars plana vitrectomy; VH Vitreous hemorrhage; PRP panretinal laser photocoagulation; IVK intravitreal kenalog; VMT vitreomacular traction; MH Macular hole;  NVD neovascularization of the disc; NVE neovascularization elsewhere; AREDS age related eye disease study; ARMD age related macular degeneration; POAG primary open angle glaucoma; EBMD epithelial/anterior basement membrane dystrophy; ACIOL anterior chamber intraocular lens; IOL intraocular lens; PCIOL posterior chamber intraocular lens; Phaco/IOL phacoemulsification with intraocular lens placement; [place] photorefractive keratectomy; LASIK laser assisted in situ keratomileusis; HTN hypertension; DM diabetes mellitus; COPD chronic obstructive pulmonary disease

## 2020-06-28 NOTE — Assessment & Plan Note (Signed)
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  No signs of recurrence.  Right to follow-up as scheduled Sep 07, 2020

## 2020-06-28 NOTE — Assessment & Plan Note (Signed)
No signs of active CNVM, no subretinal fluid nor intraretinal fluid left eye by OCT evaluation.  Patient also reports that he has had a slight improvement in vision recently.  I did ask and inquire whether the patient may have review of systems for sleep apnea.  He does report having been told to snore and his sleeping has improved from change of position from supine to sleeping on his sides.  This further suggest that he may have some at least mild to moderate form of sleep apnea but he has self treated.  I have encouraged the patient to discover whether he has maximum oxygenation at nighttime and potentially use a nighttime continuous oximeter, well new or others of similar choice to reevaluate whether he might have a form of sleep apnea.  Should this nighttime oximetry disclose evidence of significant hypoxic events, more than 5/h, he should promptly seek medical evaluation and formal sleep testing

## 2020-09-07 ENCOUNTER — Encounter (INDEPENDENT_AMBULATORY_CARE_PROVIDER_SITE_OTHER): Payer: BC Managed Care – PPO | Admitting: Ophthalmology

## 2020-09-08 ENCOUNTER — Encounter (INDEPENDENT_AMBULATORY_CARE_PROVIDER_SITE_OTHER): Payer: Self-pay | Admitting: Ophthalmology

## 2020-09-08 ENCOUNTER — Ambulatory Visit (INDEPENDENT_AMBULATORY_CARE_PROVIDER_SITE_OTHER): Payer: BC Managed Care – PPO | Admitting: Ophthalmology

## 2020-09-08 ENCOUNTER — Other Ambulatory Visit: Payer: Self-pay

## 2020-09-08 DIAGNOSIS — H353211 Exudative age-related macular degeneration, right eye, with active choroidal neovascularization: Secondary | ICD-10-CM

## 2020-09-08 DIAGNOSIS — H353132 Nonexudative age-related macular degeneration, bilateral, intermediate dry stage: Secondary | ICD-10-CM

## 2020-09-08 MED ORDER — BEVACIZUMAB 2.5 MG/0.1ML IZ SOSY
2.5000 mg | PREFILLED_SYRINGE | INTRAVITREAL | Status: AC | PRN
  Administered 2020-09-08: 2.5 mg via INTRAVITREAL

## 2020-09-08 NOTE — Assessment & Plan Note (Signed)
The nature of wet macular degeneration was discussed with the patient.  Forms of therapy reviewed include the use of Anti-VEGF medications injected painlessly into the eye, as well as other possible treatment modalities, including thermal laser therapy. Fellow eye involvement and risks were discussed with the patient. Upon the finding of wet age related macular degeneration, treatment will be offered. The treatment regimen is on a treat as needed basis with the intent to treat if necessary and extend interval of exams when possible. On average 1 out of 6 patients do not need lifetime therapy. However, the risk of recurrent disease is high for a lifetime.  Initially monthly, then periodic, examinations and evaluations will determine whether the next treatment is required on the day of the examination.  History of recurrences, now at 3 months post most recent injection Avastin, and no signs of recurrences, 6 weeks post resolution of prior treatment effect  Repeat injection today and extend interval examination to 14 weeks

## 2020-09-08 NOTE — Progress Notes (Signed)
09/08/2020     CHIEF COMPLAINT Patient presents for Retina Follow Up (3 Mo F/U OU, poss Avastin OD//Pt denies noticeable changes to [place] OU since last visit. Pt denies ocular pain, flashes of light, or floaters OU. //)   HISTORY OF PRESENT ILLNESS: Logan Mora is a 45 y.o. male who presents to the clinic today for:   HPI    Retina Follow Up    Diagnosis: Wet AMD   Laterality: right eye   Onset: 3 months ago   Severity: mild   Duration: 3 months   Course: stable   Comments: 3 Mo F/U OU, poss Avastin OD  Pt denies noticeable changes to [place] OU since last visit. Pt denies ocular pain, flashes of light, or floaters OU.          Last edited by [name], [place] on 09/08/2020  4:09 PM. (History)      Referring physician: Merrilee Seashore, MD [place],  [place] [address]  HISTORICAL INFORMATION:   Selected notes from the [place]: No current outpatient medications on file. (Ophthalmic Drugs)   No current facility-administered medications for this visit. (Ophthalmic Drugs)   Current Outpatient Medications (Other)  Medication Sig  . budesonide (RHINOCORT ALLERGY) 32 MCG/ACT nasal spray Place 1 spray into both nostrils daily.  . Fluticasone Furoate (ARNUITY ELLIPTA) 200 MCG/ACT AEPB Inhale 1 Dose into the lungs daily. Rinse, gargle, and spit after use.  . montelukast (SINGULAIR) 10 MG tablet Take 10 mg by mouth daily.   Marland Kitchen PROAIR HFA 108 (90 Base) MCG/ACT inhaler Inhale two puffs every four to six hours as needed for cough or wheeze.   No current facility-administered medications for this visit. (Other)      REVIEW OF SYSTEMS:    ALLERGIES Allergies  Allergen Reactions  . Fluvirin [Influenza Vac Split Quad] Shortness Of Breath    Gets the Flu, body aches    PAST MEDICAL HISTORY Past Medical History:  Diagnosis Date  . Asthma   . COPD (chronic obstructive pulmonary disease) ([place])   .  Tobacco abuse    Past Surgical History:  Procedure Laterality Date  . DENTAL SURGERY    . DENTAL SURGERY     Dental Implants    FAMILY HISTORY Family History  Problem Relation Age of Onset  . Bone cancer Maternal Grandmother   . Heart disease Maternal Grandfather   . Heart disease Paternal Grandfather     SOCIAL HISTORY Social History   Tobacco Use  . Smoking status: Former Smoker    Packs/day: 0.25    Types: Cigarettes  . Smokeless tobacco: Never Used  Substance Use Topics  . Alcohol use: Yes    Comment: rarely  . Drug use: No         OPHTHALMIC EXAM:  Base Eye Exam    Visual Acuity (ETDRS)      Right Left   Dist [place] 20/20 -2 20/20 -1       Tonometry (Tonopen, 4:13 PM)      Right Left   Pressure 11 13       Pupils      Pupils Dark Light Shape React APD   Right PERRL 4 3 Round Brisk None   Left PERRL 4 3 Round Brisk None       Visual Fields (Counting fingers)      Left Right    Full Full  Extraocular Movement      Right Left    Full Full       Neuro/Psych    Oriented x3: Yes   Mood/Affect: Normal       Dilation    Both eyes: 1.0% Mydriacyl, 2.5% Phenylephrine @ 4:13 PM        Slit Lamp and Fundus Exam    External Exam      Right Left   External Normal Normal       Slit Lamp Exam      Right Left   Lids/Lashes Normal Normal   Conjunctiva/Sclera White and quiet White and quiet   Cornea Clear Clear   Anterior Chamber Deep and quiet Deep and quiet   Iris Round and reactive Round and reactive   Lens Clear Clear   Anterior Vitreous Normal Normal       Fundus Exam      Right Left   Posterior Vitreous Normal Normal   Disc Normal Normal   C/D Ratio 0.3 0.4   Macula Soft drusen, Retinal pigment epithelial mottling, Early age related macular degeneration, Retinal pigment epithelial atrophy, much less subretinal fluid, no retinal hemorrhage, Disciform scar Soft drusen, no macular thickening, no membrane, Retinal pigment epithelial  mottling, Early age related macular degeneration, Retinal pigment epithelial atrophy   Vessels Normal Normal   Periphery Normal no pisciform flecks seen in periphery Normal no pisciform flecks seen in periphery          IMAGING AND PROCEDURES  Imaging and Procedures for 09/08/20  OCT, Retina - OU - Both Eyes       Right Eye Quality was good. Scan locations included subfoveal. Central Foveal Thickness: 268. Progression has been stable. Findings include disciform scar, no IRF, no SRF, abnormal foveal contour.   Left Eye Quality was good. Scan locations included subfoveal. Central Foveal Thickness: 273. Progression has been stable. Findings include normal foveal contour, no SRF.   Notes No signs of active disease, controlled currently at 3-week follow-up with subfoveal disciform scar, no intra or subretinal fluid            Intravitreal Injection, Pharmacologic Agent - OD - Right Eye       Time Out 09/08/2020. 4:58 PM. Confirmed correct patient, procedure, site, and patient consented.   Anesthesia Topical anesthesia was used. Anesthetic medications included [place] 3.5%.   Procedure Preparation included Ofloxacin , 10% betadine to eyelids, 5% betadine to ocular surface, Tobramycin 0.3%. A 30 gauge needle was used.   Injection:  2.5 mg Bevacizumab (AVASTIN) 2.72m/0.1mL SOSY   NDC:: 16109-604-54 Lot:: [id number]  Route: Intravitreal, Site: Right Eye  Post-op Post injection exam found visual acuity of at least counting fingers. The patient tolerated the procedure well. There were no complications. The patient received written and verbal post procedure care education. Post injection medications were not given.                 ASSESSMENT/PLAN:  Exudative age-related macular degeneration of right eye with active choroidal neovascularization (HCC) The nature of wet macular degeneration was discussed with the patient.  Forms of therapy reviewed include the use of Anti-VEGF  medications injected painlessly into the eye, as well as other possible treatment modalities, including thermal laser therapy. Fellow eye involvement and risks were discussed with the patient. Upon the finding of wet age related macular degeneration, treatment will be offered. The treatment regimen is on a treat as needed basis with the intent to treat if necessary  and extend interval of exams when possible. On average 1 out of 6 patients do not need lifetime therapy. However, the risk of recurrent disease is high for a lifetime.  Initially monthly, then periodic, examinations and evaluations will determine whether the next treatment is required on the day of the examination.  History of recurrences, now at 3 months post most recent injection Avastin, and no signs of recurrences, 6 weeks post resolution of prior treatment effect  Repeat injection today and extend interval examination to 14 weeks  Intermediate stage nonexudative age-related macular degeneration of both eyes No signs of CNVM OS continue to monitor      ICD-10-CM   1. Exudative age-related macular degeneration of right eye with active choroidal neovascularization (HCC)  H35.3211 OCT, Retina - OU - Both Eyes    Intravitreal Injection, Pharmacologic Agent - OD - Right Eye    bevacizumab (AVASTIN) SOSY 2.5 mg  2. Intermediate stage nonexudative age-related macular degeneration of both eyes  H35.3132     1.  Repeat injection intravitreal Avastin OD today and follow-up next in 14 weeks  2.  Dilate OD only next in 14 weeks likely Avastin injection  3.  Ophthalmic Meds Ordered this visit:  Meds ordered this encounter  Medications  . bevacizumab (AVASTIN) SOSY 2.5 mg       Return in about 14 weeks (around 12/15/2020) for dilate, OD, AVASTIN OCT.  There are no Patient Instructions on file for this visit.   Explained the diagnoses, plan, and follow up with the patient and they expressed understanding.  Patient expressed  understanding of the importance of proper follow up care.   Clent Demark Tangala Wiegert M.D. Diseases & Surgery of the Retina and Vitreous Retina & Diabetic [place] 09/08/20     Abbreviations: M myopia (nearsighted); A astigmatism; H hyperopia (farsighted); P presbyopia; Mrx spectacle prescription;  CTL contact lenses; OD right eye; OS left eye; OU both eyes  XT exotropia; ET esotropia; PEK punctate epithelial keratitis; PEE punctate epithelial erosions; DES dry eye syndrome; MGD meibomian gland dysfunction; ATs artificial tears; PFAT's preservative free artificial tears; [place] nuclear sclerotic cataract; PSC posterior subcapsular cataract; ERM epi-retinal membrane; PVD posterior vitreous detachment; RD retinal detachment; DM diabetes mellitus; DR diabetic retinopathy; NPDR non-proliferative diabetic retinopathy; PDR proliferative diabetic retinopathy; CSME clinically significant macular edema; DME diabetic macular edema; dbh dot blot hemorrhages; CWS cotton wool spot; POAG primary open angle glaucoma; C/D cup-to-disc ratio; HVF humphrey visual field; GVF goldmann visual field; OCT optical coherence tomography; IOP intraocular pressure; BRVO Branch retinal vein occlusion; CRVO central retinal vein occlusion; CRAO central retinal artery occlusion; BRAO branch retinal artery occlusion; RT retinal tear; SB scleral buckle; PPV pars plana vitrectomy; VH Vitreous hemorrhage; PRP panretinal laser photocoagulation; IVK intravitreal kenalog; VMT vitreomacular traction; MH Macular hole;  NVD neovascularization of the disc; NVE neovascularization elsewhere; AREDS age related eye disease study; ARMD age related macular degeneration; POAG primary open angle glaucoma; EBMD epithelial/anterior basement membrane dystrophy; ACIOL anterior chamber intraocular lens; IOL intraocular lens; PCIOL posterior chamber intraocular lens; Phaco/IOL phacoemulsification with intraocular lens placement; [place] photorefractive keratectomy; LASIK laser  assisted in situ keratomileusis; HTN hypertension; DM diabetes mellitus; COPD chronic obstructive pulmonary disease

## 2020-09-08 NOTE — Assessment & Plan Note (Signed)
No signs of CNVM OS continue to monitor

## 2020-11-29 ENCOUNTER — Encounter (HOSPITAL_COMMUNITY): Payer: Self-pay

## 2020-11-29 ENCOUNTER — Other Ambulatory Visit: Payer: Self-pay

## 2020-11-29 ENCOUNTER — Emergency Department (HOSPITAL_COMMUNITY)
Admission: EM | Admit: 2020-11-29 | Discharge: 2020-11-30 | Disposition: A | Discharge status: Home / Self Care | Payer: BC Managed Care – PPO | Attending: Emergency Medicine | Admitting: Emergency Medicine

## 2020-11-29 DIAGNOSIS — D72829 Elevated white blood cell count, unspecified: Secondary | ICD-10-CM | POA: Insufficient documentation

## 2020-11-29 DIAGNOSIS — J45909 Unspecified asthma, uncomplicated: Secondary | ICD-10-CM | POA: Diagnosis not present

## 2020-11-29 DIAGNOSIS — J449 Chronic obstructive pulmonary disease, unspecified: Secondary | ICD-10-CM | POA: Insufficient documentation

## 2020-11-29 DIAGNOSIS — Z7951 Long term (current) use of inhaled steroids: Secondary | ICD-10-CM | POA: Diagnosis not present

## 2020-11-29 DIAGNOSIS — L03111 Cellulitis of right axilla: Secondary | ICD-10-CM

## 2020-11-29 DIAGNOSIS — Z87891 Personal history of nicotine dependence: Secondary | ICD-10-CM | POA: Diagnosis not present

## 2020-11-29 LAB — CBC WITH DIFFERENTIAL/PLATELET
Abs Immature Granulocytes: 0.09 10*3/uL — ABNORMAL HIGH (ref 0.00–0.07)
Basophils Absolute: 0.1 10*3/uL (ref 0.0–0.1)
Basophils Relative: 0 %
Eosinophils Absolute: 0.1 10*3/uL (ref 0.0–0.5)
Eosinophils Relative: 1 %
HCT: 48 % (ref 39.0–52.0)
Hemoglobin: 16.7 g/dL (ref 13.0–17.0)
Immature Granulocytes: 1 %
Lymphocytes Relative: 10 %
Lymphs Abs: 1.8 10*3/uL (ref 0.7–4.0)
MCH: 32.1 pg (ref 26.0–34.0)
MCHC: 34.8 g/dL (ref 30.0–36.0)
MCV: 92.3 fL (ref 80.0–100.0)
Monocytes Absolute: 1.4 10*3/uL — ABNORMAL HIGH (ref 0.1–1.0)
Monocytes Relative: 8 %
Neutro Abs: 15.1 10*3/uL — ABNORMAL HIGH (ref 1.7–7.7)
Neutrophils Relative %: 80 %
Platelets: 153 10*3/uL (ref 150–400)
RBC: 5.2 MIL/uL (ref 4.22–5.81)
RDW: 12.5 % (ref 11.5–15.5)
WBC: 18.6 10*3/uL — ABNORMAL HIGH (ref 4.0–10.5)
nRBC: 0 % (ref 0.0–0.2)

## 2020-11-29 LAB — COMPREHENSIVE METABOLIC PANEL
ALT: 32 U/L (ref 0–44)
AST: 18 U/L (ref 15–41)
Albumin: 4.1 g/dL (ref 3.5–5.0)
Alkaline Phosphatase: 90 U/L (ref 38–126)
Anion gap: 11 (ref 5–15)
BUN: 13 mg/dL (ref 6–20)
CO2: 25 mmol/L (ref 22–32)
Calcium: 9.4 mg/dL (ref 8.9–10.3)
Chloride: 101 mmol/L (ref 98–111)
Creatinine, Ser: 1.34 mg/dL — ABNORMAL HIGH (ref 0.61–1.24)
GFR, Estimated: >60 mL/min (ref >60)
Glucose, Bld: 104 mg/dL — ABNORMAL HIGH (ref 70–99)
Potassium: 4.1 mmol/L (ref 3.5–5.1)
Sodium: 137 mmol/L (ref 135–145)
Total Bilirubin: 3 mg/dL — ABNORMAL HIGH (ref 0.3–1.2)
Total Protein: 7.6 g/dL (ref 6.5–8.1)

## 2020-11-29 NOTE — ED Triage Notes (Signed)
Patient complains of multiple abscesses to right axilla since Saturday am. Patient has had fever and some drainage following I & D at dermatiologist.

## 2020-11-29 NOTE — ED Provider Notes (Signed)
Emergency Medicine Provider Triage Evaluation Note  Logan Mora , a 45 y.o. male  was evaluated in triage.  Pt complains of arm abscess.  He has multiple right axilla abscesses since Saturday, went to his dermatologist and was sent here for worsening infection.  Patient states that he was prescribed antibiotics but did not take them.  States that he did have a fever last night.  Denies any nausea vomiting.  Denies any IV drug use.  Does have history of boils in his abdomen  Review of Systems  Positive: Abscess, fevers Negative: Nausea vomiting  Physical Exam  BP (!) 123/99 (BP Location: Left Arm)   Pulse (!) 111   Temp 98.6 F (37 C) (Oral)   Resp (!) 21   SpO2 100%  Gen:   Awake, no distress   Resp:  Normal effort  MSK:   Patient with abscesses on right axilla with some streaking/erythema into his right axilla Other:  Tachycardic  Medical Decision Making  Medically screening exam initiated at 12:51 PM.  Appropriate orders placed.  MALIEK SCHELLHORN was informed that the remainder of the evaluation will be completed by another provider, this initial triage assessment does not replace that evaluation, and the importance of remaining in the ED until their evaluation is complete.     Alfredia Client, PA-C 11/29/20 1254    Daleen Bo, MD 11/30/20 [phone]

## 2020-11-30 LAB — LACTIC ACID, PLASMA: Lactic Acid, Venous: 1.4 mmol/L (ref 0.5–1.9)

## 2020-11-30 MED ORDER — ACETAMINOPHEN 500 MG PO TABS
1000.0000 mg | ORAL_TABLET | Freq: Once | ORAL | Status: AC
  Administered 2020-11-30: 1000 mg via ORAL
  Filled 2020-11-30: qty 2

## 2020-11-30 MED ORDER — CEPHALEXIN 500 MG PO CAPS: 500.0000 mg | ORAL_CAPSULE | Freq: Two times a day (BID) | ORAL | 0 refills | Status: AC

## 2020-11-30 MED ORDER — SULFAMETHOXAZOLE-TRIMETHOPRIM 800-160 MG PO TABS: 1.0000 | ORAL_TABLET | Freq: Two times a day (BID) | ORAL | 0 refills | Status: AC

## 2020-11-30 MED ORDER — VANCOMYCIN HCL IN DEXTROSE 1-5 GM/200ML-% IV SOLN: 1000.0000 mg | Freq: Two times a day (BID) | INTRAVENOUS | Status: DC

## 2020-11-30 MED ORDER — VANCOMYCIN HCL 1500 MG/300ML IV SOLN
1500.0000 mg | Freq: Once | INTRAVENOUS | Status: AC
  Administered 2020-11-30: 1500 mg via INTRAVENOUS
  Filled 2020-11-30: qty 300

## 2020-11-30 MED ORDER — SODIUM CHLORIDE 0.9 % IV BOLUS
1000.0000 mL | Freq: Once | INTRAVENOUS | Status: AC
  Administered 2020-11-30: 1000 mL via INTRAVENOUS

## 2020-11-30 NOTE — ED Provider Notes (Signed)
[facility] EMERGENCY DEPARTMENT Provider Note   CSN: [id number] Arrival date & time: 11/29/20  1131     History No chief complaint on file.   Logan Mora is a 45 y.o. male.  Patient presents to the emergency department with a chief complaints of cellulitis and abscess to his right axilla.  He states that he had the abscesses lanced and drained today in the dermatologist office.  States that since then, he has had discharge from the incisions and has had redness spreading into his armpit, onto his chest wall, and down his right arm.  He reports associated moderate pain.  Denies any fever.  He has not been on any antibiotics for this.  The history is provided by the patient. No language interpreter was used.      Past Medical History:  Diagnosis Date   Asthma    COPD (chronic obstructive pulmonary disease) ([place])    Tobacco abuse     Patient Active Problem List   Diagnosis Date Noted   Exudative age-related macular degeneration of right eye with active choroidal neovascularization ([place]) 11/18/2019   Retinal hemorrhage, right eye 11/18/2019   Intermediate stage nonexudative age-related macular degeneration of both eyes 09/10/2019    Past Surgical History:  Procedure Laterality Date   DENTAL SURGERY     DENTAL SURGERY     Dental Implants       Family History  Problem Relation Age of Onset   Bone cancer Maternal Grandmother    Heart disease Maternal Grandfather    Heart disease Paternal Grandfather     Social History   Tobacco Use   Smoking status: Former    Packs/day: 0.25    Types: Cigarettes   Smokeless tobacco: Never  Substance Use Topics   Alcohol use: Yes    Comment: rarely   Drug use: No    Home Medications Prior to Admission medications   Medication Sig Start Date End Date Taking? Authorizing Provider  budesonide (RHINOCORT ALLERGY) 32 MCG/ACT nasal spray Place 1 spray into both nostrils daily. 12/14/15   Kozlow, Donnamarie Poag, MD   Fluticasone Furoate (ARNUITY ELLIPTA) 200 MCG/ACT AEPB Inhale 1 Dose into the lungs daily. Rinse, gargle, and spit after use. 12/14/15   Kozlow, Donnamarie Poag, MD  montelukast (SINGULAIR) 10 MG tablet Take 10 mg by mouth daily.  12/07/15   [provider]  PROAIR HFA 108 (90 Base) MCG/ACT inhaler Inhale two puffs every four to six hours as needed for cough or wheeze. 09/28/15   [provider]    Allergies    Fluvirin [influenza vac split quad]  Review of Systems   Review of Systems  All other systems reviewed and are negative.  Physical Exam Updated Vital Signs BP 113/82 (BP Location: Left Arm)   Pulse (!) 101   Temp 99.3 F (37.4 C) (Oral)   Resp 16   SpO2 95%   Physical Exam Vitals and nursing note reviewed.  Constitutional:      General: He is not in acute distress.    Appearance: He is well-developed. He is not ill-appearing.  HENT:     Head: Normocephalic and atraumatic.  Eyes:     Conjunctiva/sclera: Conjunctivae normal.  Cardiovascular:     Rate and Rhythm: Normal rate.  Pulmonary:     Effort: Pulmonary effort is normal. No respiratory distress.  Abdominal:     General: There is no distension.  Musculoskeletal:     Cervical back: Neck supple.  Comments: Moves all extremities  Skin:    General: Skin is warm and dry.     Comments: Erythema extending from the axilla to the chest wall and to the elbow of the right arm on the medial/posterior aspect as pictured There is purulent discharge from the recently incised abscess in the right axilla  Neurological:     Mental Status: He is alert and oriented to person, place, and time.  Psychiatric:        Mood and Affect: Mood normal.        Behavior: Behavior normal.     ED Results / Procedures / Treatments   Labs (all labs ordered are listed, but only abnormal results are displayed) Labs Reviewed  CBC WITH DIFFERENTIAL/PLATELET - Abnormal; Notable for the following components:      Result Value   WBC  18.6 (*)    Neutro Abs 15.1 (*)    Monocytes Absolute 1.4 (*)    Abs Immature Granulocytes 0.09 (*)    All other components within normal limits  COMPREHENSIVE METABOLIC PANEL - Abnormal; Notable for the following components:   Glucose, Bld 104 (*)    Creatinine, Ser 1.34 (*)    Total Bilirubin 3.0 (*)    All other components within normal limits  LACTIC ACID, PLASMA  LACTIC ACID, PLASMA    EKG None  Radiology No results found.  Procedures Procedures   Medications Ordered in ED Medications - No data to display  ED Course  I have reviewed the triage vital signs and the nursing notes.  Pertinent labs & imaging results that were available during my care of the patient were reviewed by me and considered in my medical decision making (see chart for details).    MDM Rules/Calculators/A&P                           Patient here with draining right axillary abscess and cellulitis extending onto his chest wall and down his right arm.  He is noted to be tachycardic and has leukocytosis to 18.6.  Will give fluids and give a round of vancomycin.  Patient states that he would really like to go home.  I discussed with the patient that I think it would be preferable that he be admitted to the hospital based on the spreading infection, but that it may be reasonable to trial outpatient antibiotic.  6:22 AM Reassessed.  Redness seems a bit improved.  ? If some was reaction to the tape the dermatology office used.    Patient wants to trial outpatient treatment.  Admission was offered, but ultimately declined.  Return precautions discussed.  Patient understands and agrees with the plan. Final Clinical Impression(s) / ED Diagnoses Final diagnoses:  Cellulitis of right axilla    Rx / DC Orders ED Discharge Orders          Ordered    cephALEXin (KEFLEX) 500 MG capsule  2 times daily        11/30/20 0622    sulfamethoxazole-trimethoprim (BACTRIM DS) 800-160 MG tablet  2 times daily         11/30/20 0622             Montine Circle, PA-C 11/30/20 0539    Ezequiel Essex, MD 11/30/20 [phone]

## 2020-11-30 NOTE — Progress Notes (Signed)
Pharmacy Antibiotic Note  GAREN WOOLBRIGHT is a 45 y.o. male admitted on 11/29/2020 with cellulitis.  Pharmacy has been consulted for vancomycin dosing.  Plan: Vancomycin 1500 mg IV x 1 then 1`gm IV q12 hours F/u renal function, cultures and clinical course  Height: 6' (182.9 cm) Weight: 83.9 kg (185 lb) IBW/kg (Calculated) : 77.6  Temp (24hrs), Avg:98.6 F (37 C), Min:97.9 F (36.6 C), Max:99.3 F (37.4 C)  Recent Labs  Lab 11/29/20 1251 11/30/20 0356  WBC 18.6*  --   CREATININE 1.34*  --   LATICACIDVEN  --  1.4    Estimated Creatinine Clearance: 77.2 mL/min (A) (by C-G formula based on SCr of 1.34 mg/dL (H)).    Allergies  Allergen Reactions   Fluvirin [Influenza Vac Split Quad] Shortness Of Breath    Gets the Flu, body aches   Thank you for allowing pharmacy to be a part of this patient's care.  Excell Seltzer Poteet 11/30/2020 6:18 AM

## 2020-12-16 ENCOUNTER — Encounter (INDEPENDENT_AMBULATORY_CARE_PROVIDER_SITE_OTHER): Payer: BC Managed Care – PPO | Admitting: Ophthalmology

## 2020-12-21 ENCOUNTER — Ambulatory Visit: Payer: Self-pay | Admitting: Surgery

## 2020-12-21 NOTE — H&P (Signed)
REFERRING PHYSICIAN: Dr. Ashby Dawes PCP - Donnie Coffin, MD  PROVIDER:  Carlean Jews, MD  MRN: [id number] DOB: 02-20-1976 DATE OF ENCOUNTER: 12/21/2020  Subjective   Chief Complaint: Hernia     History of Present Illness: Logan Mora is a 45 y.o. male who is seen today as an office consultation at the request of Dr. Alroy Dust for evaluation of Hernia .   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  The pain occasionally radiates towards his testicle.  No symptoms on the right.  The patient is quite active, but has decreased his workout regimen because of the discomfort.    He has had a couple of recent skin issues that were treated with oral Prednisone.  Both times, he developed MRSA skin abscesses.  Currently, he has no active skin infections and is not on any steroids.   Review of Systems: A complete review of systems was obtained from the patient.  I have reviewed this information and discussed as appropriate with the patient.  See HPI as well for other ROS.  Review of Systems  Constitutional: Negative.   HENT: Negative.   Eyes: Negative.   Respiratory: Negative.   Cardiovascular: Negative.   Gastrointestinal: Negative.   Genitourinary: Negative.   Musculoskeletal: Negative.   Skin: Negative.   Neurological: Negative.   Endo/Heme/Allergies: Negative.   Psychiatric/Behavioral: Negative.       Medical History: Past Medical History:  Diagnosis Date   Asthma without status asthmaticus, unspecified    COPD (chronic obstructive pulmonary disease) (CMS-HCC)     Patient Active Problem List  Diagnosis   Non-recurrent unilateral inguinal hernia without obstruction or gangrene    History reviewed. No pertinent surgical history.   Allergies  Allergen Reactions   Flu Vaccine 2011 (36 Mos+)(Pf) Shortness Of Breath, Nausea and Headache    Current  Outpatient Medications on File Prior to Visit  Medication Sig Dispense Refill   albuterol (PROAIR HFA) 90 mcg/actuation inhaler Inhale two puffs every four to six hours as needed for cough or wheeze.     montelukast (SINGULAIR) 10 mg tablet Take by mouth. As needed     triamcinolone 0.1 % cream TAKE 1 APPLICATION TOPICALLY TO AFFECTED AREA TWICE DAILY  0   No current facility-administered medications on file prior to visit.    Family History  Problem Relation Age of Onset   Macular degeneration Mother    Macular degeneration Father    Macular degeneration Brother    No Known Problems Maternal Aunt    No Known Problems Maternal Uncle    No Known Problems Paternal Aunt    No Known Problems Paternal Uncle    No Known Problems Maternal Grandmother    No Known Problems Maternal Grandfather    No Known Problems Paternal Grandmother    No Known Problems Paternal Grandfather      Social History   Tobacco Use  Smoking Status Current Every Day Smoker  Smokeless Tobacco Never Used     Social History   Socioeconomic History   Marital status: Married  Tobacco Use   Smoking status: Current Every Day Smoker   Smokeless tobacco: Never Used    Objective:    Vitals:   12/21/20 1554  BP: 118/70  Pulse: 98  Temp: 37.7 C (99.9 F)  SpO2: 100%  Weight: 84 kg (185 lb 3.2 oz)  Height:  182.9 cm (6')    Body mass index is 25.12 kg/m.  Physical Exam   Constitutional:  WDWN in NAD, conversant, no obvious deformities; lying in bed comfortably Eyes:  Pupils equal, round; sclera anicteric; moist conjunctiva; no lid lag HENT:  Oral mucosa moist; good dentition  Neck:  No masses palpated, trachea midline; no thyromegaly Lungs:  CTA bilaterally; normal respiratory effort CV:  Regular rate and rhythm; no murmurs; extremities well-perfused with no edema Abd:  +bowel sounds, soft, non-tender, no palpable organomegaly; no palpable hernias GU:  Bilateral descended testes; no testicular  masses; no right inguinal hernia; visible reducible left inguinal hernia Musc:  normal gait; no apparent clubbing or cyanosis in extremities Lymphatic:  No palpable cervical or axillary lymphadenopathy Skin:  Warm, dry; no sign of jaundice Psychiatric - alert and oriented x 4; calm mood and affect    Assessment and Plan:  Diagnoses and all orders for this visit:  Non-recurrent unilateral inguinal hernia without obstruction or gangrene Comments: Left inguinal hernia    Left inguinal hernia repair with mesh.  The surgical procedure has been discussed with the patient.  Potential risks, benefits, alternative treatments, and expected outcomes have been explained.  All of the patient's questions at this time have been answered.  The likelihood of reaching the patient's treatment goal is good.  The patient understand the proposed surgical procedure and wishes to proceed.   No follow-ups on file.  Carlean Jews, MD  12/21/2020 10:10 PM

## 2020-12-30 ENCOUNTER — Encounter (INDEPENDENT_AMBULATORY_CARE_PROVIDER_SITE_OTHER): Payer: BC Managed Care – PPO | Admitting: Ophthalmology

## 2021-01-06 ENCOUNTER — Encounter (INDEPENDENT_AMBULATORY_CARE_PROVIDER_SITE_OTHER): Payer: BC Managed Care – PPO | Admitting: Ophthalmology

## 2021-01-11 ENCOUNTER — Ambulatory Visit (INDEPENDENT_AMBULATORY_CARE_PROVIDER_SITE_OTHER): Payer: BC Managed Care – PPO | Admitting: Ophthalmology

## 2021-01-11 ENCOUNTER — Other Ambulatory Visit: Payer: Self-pay

## 2021-01-11 ENCOUNTER — Encounter (INDEPENDENT_AMBULATORY_CARE_PROVIDER_SITE_OTHER): Payer: Self-pay | Admitting: Ophthalmology

## 2021-01-11 DIAGNOSIS — H3561 Retinal hemorrhage, right eye: Secondary | ICD-10-CM | POA: Diagnosis not present

## 2021-01-11 DIAGNOSIS — H353211 Exudative age-related macular degeneration, right eye, with active choroidal neovascularization: Secondary | ICD-10-CM | POA: Diagnosis not present

## 2021-01-11 NOTE — Assessment & Plan Note (Signed)
Disciform scar paracentrally.  Young patient with pigmentary change suggested that this may be semipermanent or permanent resolution.  At 63-monthfollow-up today.  I notified the patient that this means this is 2 and half months without therapeutic benefit.  No recurrences of occurred.  We will asked patient to return follow-up daily examination OD in 2 months

## 2021-01-11 NOTE — Progress Notes (Signed)
01/11/2021     CHIEF COMPLAINT Patient presents for  Chief Complaint  Patient presents with   Retina Follow Up      HISTORY OF PRESENT ILLNESS: Logan Mora is a 45 y.o. male who presents to the clinic today for:   HPI     Retina Follow Up   Patient presents with  Wet AMD.  In right eye.  This started 4 months ago.  Severity is mild.  Duration of 4 months.  Since onset it is gradually improving.        Comments   4 month fu OD and OCT/ Avastin OD Pt states, "I think my vision is some better. I am not seeing the lines and disturbances in my right eye as prominently as I was before."  Some  2 to 3 months after having been bitten by a cat and developing MRSA with lymphadenopathy in the right axilla requiring systemic antibiotics      Last edited by Hurman Horn, MD on 01/11/2021  8:20 AM.      Referring physician: Merrilee Seashore, MD [address] [place],  [place] [address]  HISTORICAL INFORMATION:   Selected notes from the [facility]: No current outpatient medications on file. (Ophthalmic Drugs)   No current facility-administered medications for this visit. (Ophthalmic Drugs)   Current Outpatient Medications (Other)  Medication Sig   budesonide (RHINOCORT ALLERGY) 32 MCG/ACT nasal spray Place 1 spray into both nostrils daily.   cephALEXin (KEFLEX) 500 MG capsule Take 1 capsule (500 mg total) by mouth 2 (two) times daily.   Fluticasone Furoate (ARNUITY ELLIPTA) 200 MCG/ACT AEPB Inhale 1 Dose into the lungs daily. Rinse, gargle, and spit after use.   montelukast (SINGULAIR) 10 MG tablet Take 10 mg by mouth daily.    PROAIR HFA 108 (90 Base) MCG/ACT inhaler Inhale two puffs every four to six hours as needed for cough or wheeze.   No current facility-administered medications for this visit. (Other)      REVIEW OF SYSTEMS:    ALLERGIES Allergies  Allergen Reactions   Fluvirin [Influenza Vac Split  Quad] Shortness Of Breath    Gets the Flu, body aches    PAST MEDICAL HISTORY Past Medical History:  Diagnosis Date   Asthma    COPD (chronic obstructive pulmonary disease) ([place])    Tobacco abuse    Past Surgical History:  Procedure Laterality Date   DENTAL SURGERY     DENTAL SURGERY     Dental Implants    FAMILY HISTORY Family History  Problem Relation Age of Onset   Bone cancer Maternal Grandmother    Heart disease Maternal Grandfather    Heart disease Paternal Grandfather     SOCIAL HISTORY Social History   Tobacco Use   Smoking status: Former    Packs/day: 0.25    Types: Cigarettes   Smokeless tobacco: Never  Substance Use Topics   Alcohol use: Yes    Comment: rarely   Drug use: No         OPHTHALMIC EXAM:  Base Eye Exam     Visual Acuity (ETDRS)       Right Left   Dist [place] 20/25 +2 20/20         Tonometry (Tonopen, 8:04 AM)       Right Left   Pressure 15 17         Pupils  Pupils Dark Light Shape React APD   Right PERRL 4 3 Round Brisk None   Left PERRL 4 3 Round Brisk None         Visual Fields (Counting fingers)       Left Right    Full Full         Extraocular Movement       Right Left    Full Full         Neuro/Psych     Oriented x3: Yes   Mood/Affect: Normal         Dilation     Right eye: 1.0% Mydriacyl, 2.5% Phenylephrine @ 8:04 AM           Slit Lamp and Fundus Exam     External Exam       Right Left   External Normal Normal         Slit Lamp Exam       Right Left   Lids/Lashes Normal Normal   Conjunctiva/Sclera White and quiet White and quiet   Cornea Clear Clear   Anterior Chamber Deep and quiet Deep and quiet   Iris Round and reactive Round and reactive   Lens Clear Clear   Anterior Vitreous Normal Normal         Fundus Exam       Right Left   Posterior Vitreous Normal    Disc Normal    C/D Ratio 0.3 0.4   Macula Soft drusen, Retinal pigment epithelial mottling,  Early age related macular degeneration, Retinal pigment epithelial atrophy, much less subretinal fluid, no retinal hemorrhage, Disciform scar    Vessels Normal    Periphery Normal no pisciform flecks seen in periphery  no pisciform flecks seen in periphery            IMAGING AND PROCEDURES  Imaging and Procedures for 01/11/21  OCT, Retina - OU - Both Eyes       Right Eye Quality was good. Scan locations included subfoveal. Central Foveal Thickness: 265. Progression has been stable. Findings include disciform scar, no IRF, no SRF, abnormal foveal contour.   Left Eye Quality was good. Scan locations included subfoveal. Central Foveal Thickness: 264. Progression has been stable. Findings include normal foveal contour, no SRF.   Notes No signs of active disease, controlled currently at 4 months follow-up with subfoveal disciform scar, no intra or subretinal fluid no sign of CNVM                 ASSESSMENT/PLAN:  Retinal hemorrhage, right eye Apparent resolved  Exudative age-related macular degeneration of right eye with active choroidal neovascularization (HCC) Disciform scar paracentrally.  Young patient with pigmentary change suggested that this may be semipermanent or permanent resolution.  At 45-monthfollow-up today.  I notified the patient that this means this is 2 and half months without therapeutic benefit.  No recurrences of occurred.  We will asked patient to return follow-up daily examination OD in 2 months     ICD-10-CM   1. Exudative age-related macular degeneration of right eye with active choroidal neovascularization (HCC)  H35.3211 OCT, Retina - OU - Both Eyes    CANCELED: Intravitreal Injection, Pharmacologic Agent - OD - Right Eye    2. Retinal hemorrhage, right eye  H35.61       1.  OD some 2-1/2 months after the waning effect of antivegF and still no sign of recurrence.  This young patient we will choose to observe but follow-up  in a shorter  interval of 2 more months.  2.  He will report immediately any new onset visual acuity declines distortions or changes  3.  Ophthalmic Meds Ordered this visit:  No orders of the defined types were placed in this encounter.      Return in about 8 weeks (around 03/08/2021) for dilate, OD, OCT.  There are no Patient Instructions on file for this visit.   Explained the diagnoses, plan, and follow up with the patient and they expressed understanding.  Patient expressed understanding of the importance of proper follow up care.   Clent Demark Briteny Fulghum M.D. Diseases & Surgery of the Retina and Vitreous Retina & Diabetic [place] 01/11/21     Abbreviations: M myopia (nearsighted); A astigmatism; H hyperopia (farsighted); P presbyopia; Mrx spectacle prescription;  CTL contact lenses; OD right eye; OS left eye; OU both eyes  XT exotropia; ET esotropia; PEK punctate epithelial keratitis; PEE punctate epithelial erosions; DES dry eye syndrome; MGD meibomian gland dysfunction; ATs artificial tears; PFAT's preservative free artificial tears; [place] nuclear sclerotic cataract; PSC posterior subcapsular cataract; ERM epi-retinal membrane; PVD posterior vitreous detachment; RD retinal detachment; DM diabetes mellitus; DR diabetic retinopathy; NPDR non-proliferative diabetic retinopathy; PDR proliferative diabetic retinopathy; CSME clinically significant macular edema; DME diabetic macular edema; dbh dot blot hemorrhages; CWS cotton wool spot; POAG primary open angle glaucoma; C/D cup-to-disc ratio; HVF humphrey visual field; GVF goldmann visual field; OCT optical coherence tomography; IOP intraocular pressure; BRVO Branch retinal vein occlusion; CRVO central retinal vein occlusion; CRAO central retinal artery occlusion; BRAO branch retinal artery occlusion; RT retinal tear; SB scleral buckle; PPV pars plana vitrectomy; VH Vitreous hemorrhage; PRP panretinal laser photocoagulation; IVK intravitreal kenalog; VMT  vitreomacular traction; MH Macular hole;  NVD neovascularization of the disc; NVE neovascularization elsewhere; AREDS age related eye disease study; ARMD age related macular degeneration; POAG primary open angle glaucoma; EBMD epithelial/anterior basement membrane dystrophy; ACIOL anterior chamber intraocular lens; IOL intraocular lens; PCIOL posterior chamber intraocular lens; Phaco/IOL phacoemulsification with intraocular lens placement; [place] photorefractive keratectomy; LASIK laser assisted in situ keratomileusis; HTN hypertension; DM diabetes mellitus; COPD chronic obstructive pulmonary disease

## 2021-01-11 NOTE — Assessment & Plan Note (Signed)
Apparent resolved

## 2021-03-08 ENCOUNTER — Encounter (INDEPENDENT_AMBULATORY_CARE_PROVIDER_SITE_OTHER): Payer: BC Managed Care – PPO | Admitting: Ophthalmology

## 2021-03-15 ENCOUNTER — Ambulatory Visit (INDEPENDENT_AMBULATORY_CARE_PROVIDER_SITE_OTHER): Payer: BC Managed Care – PPO | Admitting: Ophthalmology

## 2021-03-15 ENCOUNTER — Encounter (INDEPENDENT_AMBULATORY_CARE_PROVIDER_SITE_OTHER): Payer: Self-pay | Admitting: Ophthalmology

## 2021-03-15 ENCOUNTER — Encounter (INDEPENDENT_AMBULATORY_CARE_PROVIDER_SITE_OTHER): Payer: BC Managed Care – PPO | Admitting: Ophthalmology

## 2021-03-15 ENCOUNTER — Other Ambulatory Visit: Payer: Self-pay

## 2021-03-15 DIAGNOSIS — H353211 Exudative age-related macular degeneration, right eye, with active choroidal neovascularization: Secondary | ICD-10-CM

## 2021-03-15 DIAGNOSIS — R0683 Snoring: Secondary | ICD-10-CM

## 2021-03-15 DIAGNOSIS — H353212 Exudative age-related macular degeneration, right eye, with inactive choroidal neovascularization: Secondary | ICD-10-CM | POA: Diagnosis not present

## 2021-03-15 HISTORY — DX: Snoring: R06.83

## 2021-03-15 NOTE — Assessment & Plan Note (Signed)
I have urged the patient to seek home sleep study simply to look for any factors that could exacerbate the development of macular degeneration complications such as CNVM formation.  This would include the macular hypoxia and hypertensive episodic changes which occur with untreated sleep apnea

## 2021-03-15 NOTE — Assessment & Plan Note (Signed)
OD now 6 months post most recent injection OS 4-1/2 months off of therapeutic effect of antivegF.  No sign of recurrences  I spent some time reviewing the patient that all inciting factors should be managed in if in possible treated.  This includes smoking.  I undertook providing analogy of inhaling carbon monoxide as the harmful effect of inhaling smoke of any kind on a consistent basis which decreases oxygenation the triggering and worsening the conditions we discussed

## 2021-03-15 NOTE — Progress Notes (Signed)
03/15/2021     CHIEF COMPLAINT Patient presents for  Chief Complaint  Patient presents with   Retina Follow Up      HISTORY OF PRESENT ILLNESS: Logan Mora is a 45 y.o. male who presents to the clinic today for:   HPI     Retina Follow Up   Patient presents with  Wet AMD.  In right eye.  This started 9 weeks ago.  Duration of 9 weeks.  Since onset it is stable.        Comments   Pt c/o new FOL in the inferior temporal region of the right eye, started ~ 1 week ago, no associated floaters.       Last edited by Reather Littler, COA on 03/15/2021  8:22 AM.      Referring physician: Merrilee Seashore, MD [address] [place],  [place] [address]  HISTORICAL INFORMATION:   Selected notes from the [facility]: No current outpatient medications on file. (Ophthalmic Drugs)   No current facility-administered medications for this visit. (Ophthalmic Drugs)   Current Outpatient Medications (Other)  Medication Sig   budesonide (RHINOCORT ALLERGY) 32 MCG/ACT nasal spray Place 1 spray into both nostrils daily.   cephALEXin (KEFLEX) 500 MG capsule Take 1 capsule (500 mg total) by mouth 2 (two) times daily.   Fluticasone Furoate (ARNUITY ELLIPTA) 200 MCG/ACT AEPB Inhale 1 Dose into the lungs daily. Rinse, gargle, and spit after use.   montelukast (SINGULAIR) 10 MG tablet Take 10 mg by mouth daily.    PROAIR HFA 108 (90 Base) MCG/ACT inhaler Inhale two puffs every four to six hours as needed for cough or wheeze.   No current facility-administered medications for this visit. (Other)      REVIEW OF SYSTEMS: ROS   Negative for: Constitutional, Gastrointestinal, Neurological, Skin, Genitourinary, Musculoskeletal, HENT, Endocrine, Cardiovascular, Eyes, Respiratory, Psychiatric, Allergic/Imm, Heme/Lymph Last edited by Hurman Horn, MD on 03/15/2021  9:00 AM.       ALLERGIES Allergies  Allergen Reactions   Fluvirin  [Influenza Vac Split Quad] Shortness Of Breath    Gets the Flu, body aches   Prednisone Other (See Comments)    Pt states he experiences dizziness and fog with thinking    PAST MEDICAL HISTORY Past Medical History:  Diagnosis Date   Asthma    COPD (chronic obstructive pulmonary disease) ([place])    Retinal hemorrhage, right eye 11/18/2019   Tobacco abuse    Past Surgical History:  Procedure Laterality Date   DENTAL SURGERY     DENTAL SURGERY     Dental Implants    FAMILY HISTORY Family History  Problem Relation Age of Onset   Bone cancer Maternal Grandmother    Heart disease Maternal Grandfather    Heart disease Paternal Grandfather     SOCIAL HISTORY Social History   Tobacco Use   Smoking status: Former    Packs/day: 0.25    Types: Cigarettes   Smokeless tobacco: Never  Substance Use Topics   Alcohol use: Yes    Comment: rarely   Drug use: No         OPHTHALMIC EXAM:  Base Eye Exam     Visual Acuity (ETDRS)       Right Left   Dist [place] 20/25 +2 20/20         Tonometry (Tonopen, 8:27 AM)       Right Left   Pressure 11  15         Pupils       Pupils Dark Light Shape React APD   Right PERRL 4 3 Round Brisk None   Left PERRL 4 3 Round Brisk None         Visual Fields (Counting fingers)       Left Right    Full Full         Extraocular Movement       Right Left    Full, Ortho Full, Ortho         Neuro/Psych     Oriented x3: Yes   Mood/Affect: Normal         Dilation     Both eyes: 1.0% Mydriacyl, 2.5% Phenylephrine @ 8:27 AM           Slit Lamp and Fundus Exam     External Exam       Right Left   External Normal Normal         Slit Lamp Exam       Right Left   Lids/Lashes Normal Normal   Conjunctiva/Sclera White and quiet White and quiet   Cornea Clear Clear   Anterior Chamber Deep and quiet Deep and quiet   Iris Round and reactive Round and reactive   Lens Clear Clear   Anterior Vitreous Normal  Normal         Fundus Exam       Right Left   Posterior Vitreous Normal Normal   Disc Normal Normal   C/D Ratio 0.3 0.4   Macula Soft drusen, Retinal pigment epithelial mottling, Early age related macular degeneration, Retinal pigment epithelial atrophy, much less subretinal fluid, no retinal hemorrhage, Disciform scar Hard drusen   Vessels Normal Normal   Periphery Normal no pisciform flecks seen in periphery  no pisciform flecks seen in periphery            IMAGING AND PROCEDURES  Imaging and Procedures for 03/15/21  OCT, Retina - OU - Both Eyes       Right Eye Quality was good. Scan locations included subfoveal. Central Foveal Thickness: 268. Progression has been stable. Findings include disciform scar, no IRF, no SRF, abnormal foveal contour.   Left Eye Quality was good. Scan locations included subfoveal. Central Foveal Thickness: 261. Progression has been stable. Findings include normal foveal contour, no SRF.   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  No sign of recurrences  I spent some time reviewing the patient that all inciting factors should be managed in if in possible treated.  This includes smoking.  I undertook providing analogy of inhaling carbon monoxide as the harmful effect of inhaling smoke of any kind on a consistent basis which decreases oxygenation the triggering and worsening the conditions we discussed  Snores I have urged the patient to seek home sleep study simply to look for any factors that could exacerbate the development of macular degeneration complications such as CNVM  formation.  This would include the macular hypoxia and hypertensive episodic changes which occur with untreated sleep apnea  ICD-10-CM   1. Exudative age-related macular degeneration of right eye with active choroidal neovascularization (HCC)  H35.3211 OCT, Retina - OU - Both Eyes    2. Exudative age-related macular degeneration of right eye with inactive choroidal neovascularization ([place])  H35.3212     3. Snores  R06.83       1.  OD no sign of recurrence of CNVM thus now inactive.  Currently at 58-monthinterval post most recent injection is 4-1/2 months off of therapeutic effect of antivegF  2.  Patient is positive review of system for possible sleep apnea with snoring per tickly while smoking, we also discussed issues of smoking.  I do suggest cessation of smoking permanently  3.  I would suggest home sleep study, under the direction of Dr. RLoreen Freudand to determine whether or not this possible contributory factor is present triggering this wet AMD recurrences in this young man  Ophthalmic Meds Ordered this visit:  No orders of the defined types were placed in this encounter.      Return in about 4 months (around 07/13/2021) for DILATE OU, COLOR FP, OCT.  There are no Patient Instructions on file for this visit.   Explained the diagnoses, plan, and follow up with the patient and they expressed understanding.  Patient expressed understanding of the importance of proper follow up care.   GClent DemarkRankin M.D. Diseases & Surgery of the Retina and Vitreous Retina & Diabetic [place]11/08/22     Abbreviations: M myopia (nearsighted); A astigmatism; H hyperopia (farsighted); P presbyopia; Mrx spectacle prescription;  CTL contact lenses; OD right eye; OS left eye; OU both eyes  XT exotropia; ET esotropia; PEK punctate epithelial keratitis; PEE punctate epithelial erosions; DES dry eye syndrome; MGD meibomian gland dysfunction; ATs artificial tears; PFAT's preservative free  artificial tears; NBlythenuclear sclerotic cataract; PSC posterior subcapsular cataract; ERM epi-retinal membrane; PVD posterior vitreous detachment; RD retinal detachment; DM diabetes mellitus; DR diabetic retinopathy; NPDR non-proliferative diabetic retinopathy; PDR proliferative diabetic retinopathy; CSME clinically significant macular edema; DME diabetic macular edema; dbh dot blot hemorrhages; CWS cotton wool spot; POAG primary open angle glaucoma; C/D cup-to-disc ratio; HVF humphrey visual field; GVF goldmann visual field; OCT optical coherence tomography; IOP intraocular pressure; BRVO Branch retinal vein occlusion; CRVO central retinal vein occlusion; CRAO central retinal artery occlusion; BRAO branch retinal artery occlusion; RT retinal tear; SB scleral buckle; PPV pars plana vitrectomy; VH Vitreous hemorrhage; PRP panretinal laser photocoagulation; IVK intravitreal kenalog; VMT vitreomacular traction; MH Macular hole;  NVD neovascularization of the disc; NVE neovascularization elsewhere; AREDS age related eye disease study; ARMD age related macular degeneration; POAG primary open angle glaucoma; EBMD epithelial/anterior basement membrane dystrophy; ACIOL anterior chamber intraocular lens; IOL intraocular lens; PCIOL posterior chamber intraocular lens; Phaco/IOL phacoemulsification with intraocular lens placement; PHoumaphotorefractive keratectomy; LASIK laser assisted in situ keratomileusis; HTN hypertension; DM diabetes mellitus; COPD chronic obstructive pulmonary disease

## 2021-07-11 ENCOUNTER — Ambulatory Visit (INDEPENDENT_AMBULATORY_CARE_PROVIDER_SITE_OTHER): Payer: BC Managed Care – PPO | Admitting: Ophthalmology

## 2021-07-11 ENCOUNTER — Encounter (INDEPENDENT_AMBULATORY_CARE_PROVIDER_SITE_OTHER): Payer: Self-pay | Admitting: Ophthalmology

## 2021-07-11 ENCOUNTER — Other Ambulatory Visit: Payer: Self-pay

## 2021-07-11 DIAGNOSIS — H353211 Exudative age-related macular degeneration, right eye, with active choroidal neovascularization: Secondary | ICD-10-CM | POA: Diagnosis not present

## 2021-07-11 DIAGNOSIS — H353212 Exudative age-related macular degeneration, right eye, with inactive choroidal neovascularization: Secondary | ICD-10-CM

## 2021-07-11 DIAGNOSIS — H353132 Nonexudative age-related macular degeneration, bilateral, intermediate dry stage: Secondary | ICD-10-CM

## 2021-07-11 DIAGNOSIS — Z87891 Personal history of nicotine dependence: Secondary | ICD-10-CM | POA: Diagnosis not present

## 2021-07-11 NOTE — Assessment & Plan Note (Signed)
I explained that this is very important because it decreases the blood level of circulating carbon monoxide thus minimizing risk of progression of macular degeneration in each eye ?

## 2021-07-11 NOTE — Assessment & Plan Note (Signed)
Last treatment injection May 2022, patient doing very well. ? ?Likely to have fewer chances of recurrences since he has discontinued smoking January 2023 ?

## 2021-07-11 NOTE — Progress Notes (Signed)
07/11/2021     CHIEF COMPLAINT Patient presents for  Chief Complaint  Patient presents with   Macular Degeneration      HISTORY OF PRESENT ILLNESS: Logan Mora is a 46 y.o. male who presents to the clinic today for:   HPI   Pt states "that OS vision is getting worse. Im just seeing blurry spots."  4 mos fu oct fp  No recent changes to medical history, pt states everything is the same.  No medicated eye drops  Last edited by Silvestre Moment on 07/11/2021  8:10 AM.      Referring physician: Merrilee Seashore, MD [address] [place],  [place] [address]  HISTORICAL INFORMATION:   Selected notes from the [facility]: No current outpatient medications on file. (Ophthalmic Drugs)   No current facility-administered medications for this visit. (Ophthalmic Drugs)   Current Outpatient Medications (Other)  Medication Sig   budesonide (RHINOCORT ALLERGY) 32 MCG/ACT nasal spray Place 1 spray into both nostrils daily.   cephALEXin (KEFLEX) 500 MG capsule Take 1 capsule (500 mg total) by mouth 2 (two) times daily.   Fluticasone Furoate (ARNUITY ELLIPTA) 200 MCG/ACT AEPB Inhale 1 Dose into the lungs daily. Rinse, gargle, and spit after use.   montelukast (SINGULAIR) 10 MG tablet Take 10 mg by mouth daily.    PROAIR HFA 108 (90 Base) MCG/ACT inhaler Inhale two puffs every four to six hours as needed for cough or wheeze.   No current facility-administered medications for this visit. (Other)      REVIEW OF SYSTEMS: ROS   Negative for: Constitutional, Gastrointestinal, Neurological, Skin, Genitourinary, Musculoskeletal, HENT, Endocrine, Cardiovascular, Eyes, Respiratory, Psychiatric, Allergic/Imm, Heme/Lymph Last edited by Hurman Horn, MD on 07/11/2021  9:18 AM.       ALLERGIES Allergies  Allergen Reactions   Fluvirin [Influenza Vac Split Quad] Shortness Of Breath    Gets the Flu, body aches   Prednisone Other (See  Comments)    Pt states he experiences dizziness and fog with thinking    PAST MEDICAL HISTORY Past Medical History:  Diagnosis Date   Asthma    COPD (chronic obstructive pulmonary disease) ([place])    Retinal hemorrhage, right eye 11/18/2019   Tobacco abuse    Past Surgical History:  Procedure Laterality Date   DENTAL SURGERY     DENTAL SURGERY     Dental Implants    FAMILY HISTORY Family History  Problem Relation Age of Onset   Bone cancer Maternal Grandmother    Heart disease Maternal Grandfather    Heart disease Paternal Grandfather     SOCIAL HISTORY Social History   Tobacco Use   Smoking status: Former    Packs/day: 0.25    Types: Cigarettes   Smokeless tobacco: Never  Substance Use Topics   Alcohol use: Yes    Comment: rarely   Drug use: No         OPHTHALMIC EXAM:  Base Eye Exam     Visual Acuity (Snellen - Linear)       Right Left   Dist [place] 20/25 -2 20/20 -1         Tonometry (Tonopen, 8:11 AM)       Right Left   Pressure 11 10         Pupils       Pupils   Right PERRL   Left PERRL  Neuro/Psych     Oriented x3: Yes   Mood/Affect: Normal         Dilation     Both eyes: 1.0% Mydriacyl, 2.5% Phenylephrine @ 8:11 AM           Slit Lamp and Fundus Exam     External Exam       Right Left   External Normal Normal         Slit Lamp Exam       Right Left   Lids/Lashes Normal Normal   Conjunctiva/Sclera White and quiet White and quiet   Cornea Clear Clear   Anterior Chamber Deep and quiet Deep and quiet   Iris Round and reactive Round and reactive   Lens Clear Clear   Anterior Vitreous Normal Normal         Fundus Exam       Right Left   Posterior Vitreous Normal Normal   Disc Normal Normal   C/D Ratio 0.3 0.4   Macula Soft drusen, Retinal pigment epithelial mottling, Early age related macular degeneration, Retinal pigment epithelial atrophy, much less subretinal fluid, no retinal hemorrhage,  Disciform scar Hard drusen, Intermediate age related macular degeneration   Vessels Normal Normal   Periphery Normal no pisciform flecks seen in periphery  no pisciform flecks seen in periphery            IMAGING AND PROCEDURES  Imaging and Procedures for 07/11/21  OCT, Retina - OU - Both Eyes       Right Eye Quality was good. Scan locations included subfoveal. Central Foveal Thickness: 272. Progression has been stable. Findings include disciform scar, no IRF, no SRF, abnormal foveal contour.   Left Eye Quality was good. Scan locations included subfoveal. Central Foveal Thickness: 269. Progression has been stable. Findings include normal foveal contour, no SRF.   Notes No signs of active disease, controlled currently at 4 months follow-up with subfoveal disciform scar, no intra or subretinal fluid no sign of CNVM              Color Fundus Photography Optos - OU - Both Eyes       Right Eye Progression has worsened. Disc findings include normal observations. Macula : drusen, exudates, retinal pigment epithelium abnormalities, hemorrhage. Vessels : normal observations. Periphery : normal observations.   Left Eye Progression has been stable. Disc findings include normal observations. Macula : retinal pigment epithelium abnormalities, drusen. Vessels : normal observations. Periphery : normal observations.   Notes No subretinal fluid no subretinal hemorrhage remains, subfoveal scarring OD             ASSESSMENT/PLAN:  Discontinued smoking I explained that this is very important because it decreases the blood level of circulating carbon monoxide thus minimizing risk of progression of macular degeneration in each eye  Intermediate stage nonexudative age-related macular degeneration of both eyes No signs of significant progression  Exudative age-related macular degeneration of right eye with inactive choroidal neovascularization ([place]) Last treatment injection  May 2022, patient doing very well.  Likely to have fewer chances of recurrences since he has discontinued smoking January 2023     ICD-10-CM   1. Exudative age-related macular degeneration of right eye with active choroidal neovascularization (HCC)  H35.3211 OCT, Retina - OU - Both Eyes    Color Fundus Photography Optos - OU - Both Eyes    2. Exudative age-related macular degeneration of right eye with inactive choroidal neovascularization ([place])  H35.3212     3.  Discontinued smoking  Z87.891     4. Intermediate stage nonexudative age-related macular degeneration of both eyes  H35.3132       1.  OU, no signs of wet AMD today.  OD, large subfoveal white disciform scar well seen and characterize onset on OCT however no signs of active disease today With presents preserved acuity.  We will continue to observe  2.  Patient understands critical benefits of discontinuing smoking so as to minimize risk of macular disease recurrence  3.  Ophthalmic Meds Ordered this visit:  No orders of the defined types were placed in this encounter.      Return in about 6 months (around 01/11/2022) for DILATE OU, COLOR FP, OCT.  There are no Patient Instructions on file for this visit.   Explained the diagnoses, plan, and follow up with the patient and they expressed understanding.  Patient expressed understanding of the importance of proper follow up care.   Clent Demark Nylee Barbuto M.D. Diseases & Surgery of the Retina and Vitreous Retina & Diabetic [place] [facility] 07/11/21     Abbreviations: M myopia (nearsighted); A astigmatism; H hyperopia (farsighted); P presbyopia; Mrx spectacle prescription;  CTL contact lenses; OD right eye; OS left eye; OU both eyes  XT exotropia; ET esotropia; PEK punctate epithelial keratitis; PEE punctate epithelial erosions; DES dry eye syndrome; MGD meibomian gland dysfunction; ATs artificial tears; PFAT's preservative free artificial tears; [place] nuclear sclerotic cataract; PSC  posterior subcapsular cataract; ERM epi-retinal membrane; PVD posterior vitreous detachment; RD retinal detachment; DM diabetes mellitus; DR diabetic retinopathy; NPDR non-proliferative diabetic retinopathy; PDR proliferative diabetic retinopathy; CSME clinically significant macular edema; DME diabetic macular edema; dbh dot blot hemorrhages; CWS cotton wool spot; POAG primary open angle glaucoma; C/D cup-to-disc ratio; HVF humphrey visual field; GVF goldmann visual field; OCT optical coherence tomography; IOP intraocular pressure; BRVO Branch retinal vein occlusion; CRVO central retinal vein occlusion; CRAO central retinal artery occlusion; BRAO branch retinal artery occlusion; RT retinal tear; SB scleral buckle; PPV pars plana vitrectomy; VH Vitreous hemorrhage; PRP panretinal laser photocoagulation; IVK intravitreal kenalog; VMT vitreomacular traction; MH Macular hole;  NVD neovascularization of the disc; NVE neovascularization elsewhere; AREDS age related eye disease study; ARMD age related macular degeneration; POAG primary open angle glaucoma; EBMD epithelial/anterior basement membrane dystrophy; ACIOL anterior chamber intraocular lens; IOL intraocular lens; PCIOL posterior chamber intraocular lens; Phaco/IOL phacoemulsification with intraocular lens placement; [place] photorefractive keratectomy; LASIK laser assisted in situ keratomileusis; HTN hypertension; DM diabetes mellitus; COPD chronic obstructive pulmonary disease

## 2021-07-11 NOTE — Assessment & Plan Note (Signed)
No signs of significant progression ?

## 2021-07-13 ENCOUNTER — Encounter (INDEPENDENT_AMBULATORY_CARE_PROVIDER_SITE_OTHER): Payer: BC Managed Care – PPO | Admitting: Ophthalmology

## 2022-01-11 ENCOUNTER — Ambulatory Visit (INDEPENDENT_AMBULATORY_CARE_PROVIDER_SITE_OTHER): Payer: BC Managed Care – PPO | Admitting: Ophthalmology

## 2022-01-11 ENCOUNTER — Encounter (INDEPENDENT_AMBULATORY_CARE_PROVIDER_SITE_OTHER): Payer: Self-pay | Admitting: Ophthalmology

## 2022-01-11 DIAGNOSIS — H2513 Age-related nuclear cataract, bilateral: Secondary | ICD-10-CM | POA: Insufficient documentation

## 2022-01-11 DIAGNOSIS — H353211 Exudative age-related macular degeneration, right eye, with active choroidal neovascularization: Secondary | ICD-10-CM

## 2022-01-11 DIAGNOSIS — H353132 Nonexudative age-related macular degeneration, bilateral, intermediate dry stage: Secondary | ICD-10-CM

## 2022-01-11 DIAGNOSIS — H353212 Exudative age-related macular degeneration, right eye, with inactive choroidal neovascularization: Secondary | ICD-10-CM

## 2022-01-11 NOTE — Assessment & Plan Note (Signed)
Stable OU 

## 2022-01-11 NOTE — Progress Notes (Signed)
01/11/2022     CHIEF COMPLAINT Patient presents for  Chief Complaint  Patient presents with   Macular Degeneration      HISTORY OF PRESENT ILLNESS: Logan Mora is a 46 y.o. male who presents to the clinic today for:   HPI   History of wet AMD OD.  Now off therapy for over 2-1/2 years, coincidentally off smoking for 9 months  6 mths dilate ou color fp oct Pt states his vision has been stable Pt denies any new floaters or FOL  Last edited by Logan Crape, MD on 01/11/2022  8:44 AM.      Referring physician: Georgianne Fick, MD [address] [place],  [place] [address]  HISTORICAL INFORMATION:   Selected notes from the MEDICAL RECORD NUMBER       CURRENT MEDICATIONS: No current outpatient medications on file. (Ophthalmic Drugs)   No current facility-administered medications for this visit. (Ophthalmic Drugs)   Current Outpatient Medications (Other)  Medication Sig   budesonide (RHINOCORT ALLERGY) 32 MCG/ACT nasal spray Place 1 spray into both nostrils daily.   cephALEXin (KEFLEX) 500 MG capsule Take 1 capsule (500 mg total) by mouth 2 (two) times daily.   Fluticasone Furoate (ARNUITY ELLIPTA) 200 MCG/ACT AEPB Inhale 1 Dose into the lungs daily. Rinse, gargle, and spit after use.   montelukast (SINGULAIR) 10 MG tablet Take 10 mg by mouth daily.    PROAIR HFA 108 (90 Base) MCG/ACT inhaler Inhale two puffs every four to six hours as needed for cough or wheeze.   No current facility-administered medications for this visit. (Other)      REVIEW OF SYSTEMS: ROS   Negative for: Constitutional, Gastrointestinal, Neurological, Skin, Genitourinary, Musculoskeletal, HENT, Endocrine, Cardiovascular, Eyes, Respiratory, Psychiatric, Allergic/Imm, Heme/Lymph Last edited by Erling Cruz D, CMA on 01/11/2022  8:05 AM.       ALLERGIES Allergies  Allergen Reactions   Fluvirin [Influenza Vac Split Quad] Shortness Of Breath    Gets the Flu, body aches    Prednisone Other (See Comments)    Pt states he experiences dizziness and fog with thinking    PAST MEDICAL HISTORY Past Medical History:  Diagnosis Date   Asthma    COPD (chronic obstructive pulmonary disease) (HCC)    Retinal hemorrhage, right eye 11/18/2019   Snores 03/15/2021   Self-reports snoring particularly when he is periodically smoking   Tobacco abuse    Past Surgical History:  Procedure Laterality Date   DENTAL SURGERY     DENTAL SURGERY     Dental Implants    FAMILY HISTORY Family History  Problem Relation Age of Onset   Bone cancer Maternal Grandmother    Heart disease Maternal Grandfather    Heart disease Paternal Grandfather     SOCIAL HISTORY Social History   Tobacco Use   Smoking status: Former    Packs/day: 0.25    Types: Cigarettes   Smokeless tobacco: Never  Substance Use Topics   Alcohol use: Yes    Comment: rarely   Drug use: No         OPHTHALMIC EXAM:  Base Eye Exam     Visual Acuity (ETDRS)       Right Left   Dist [place] 20/20 -1 20/20 -1         Tonometry (Tonopen, 8:09 AM)       Right Left   Pressure 15 17         Pupils  Pupils   Right PERRL   Left PERRL         Visual Fields       Left Right    Full Full         Extraocular Movement       Right Left    Ortho Ortho    -- -- --  --  --  -- -- --   -- -- --  --  --  -- -- --           Neuro/Psych     Oriented x3: Yes   Mood/Affect: Normal         Dilation     Both eyes: 1.0% Mydriacyl, 2.5% Phenylephrine @ 8:06 AM           Slit Lamp and Fundus Exam     External Exam       Right Left   External Normal Normal         Slit Lamp Exam       Right Left   Lids/Lashes Normal Normal   Conjunctiva/Sclera White and quiet White and quiet   Cornea Clear Clear   Anterior Chamber Deep and quiet Deep and quiet   Iris Round and reactive Round and reactive   Lens Clear Clear   Anterior Vitreous Normal Normal          Fundus Exam       Right Left   Posterior Vitreous Normal Normal   Disc Normal Normal   C/D Ratio 0.3 0.4   Macula Soft drusen, Retinal pigment epithelial mottling, Early age related macular degeneration, Retinal pigment epithelial atrophy, much less subretinal fluid, no retinal hemorrhage, Disciform scar Hard drusen, Intermediate age related macular degeneration   Vessels Normal Normal   Periphery Normal no pisciform flecks seen in periphery  no pisciform flecks seen in periphery            IMAGING AND PROCEDURES  Imaging and Procedures for 01/11/22  OCT, Retina - OU - Both Eyes       Right Eye Quality was good. Scan locations included subfoveal. Central Foveal Thickness: 278. Progression has been stable. Findings include no IRF, no SRF, abnormal foveal contour, disciform scar.   Left Eye Quality was good. Scan locations included subfoveal. Central Foveal Thickness: 274. Progression has been stable. Findings include normal foveal contour, no SRF.   Notes No signs of active disease, controlled currently at 4 months follow-up with subfoveal disciform scar, no intra or subretinal fluid no sign of CNVM              Color Fundus Photography Optos - OU - Both Eyes       Right Eye Progression has worsened. Disc findings include normal observations. Macula : drusen, exudates, retinal pigment epithelium abnormalities, hemorrhage. Vessels : normal observations. Periphery : normal observations.   Left Eye Progression has been stable. Disc findings include normal observations. Macula : drusen, retinal pigment epithelium abnormalities. Vessels : normal observations. Periphery : normal observations.   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  Now  off therapy for over 2 and half year  Intermediate stage nonexudative age-related macular degeneration of both eyes Stable OU.  Nuclear sclerotic cataract of both eyes Physiologic, trace no impact  on acuity     ICD-10-CM   1. Exudative age-related macular degeneration of right eye with active choroidal neovascularization (HCC)  H35.3211 OCT, Retina - OU - Both Eyes    Color Fundus Photography Optos - OU - Both Eyes    2. 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 Intermediate stage nonexudative age-related macular degeneration of both eyes  H35.3132     4. Nuclear sclerotic cataract of both eyes  H25.13       1.  OD doing very well.  No signs of recurrence of CNVM.  Subfoveal location of drusenoid deposit remains.  Not likely to recur now off therapy for 2 and half years and patient has discontinued smoking with the secondary effect of diminishing carbon oxide intake.    2.  Intermittent ARMD OU.  Stable overall.  Follow-up 9 months  3.  Ophthalmic Meds Ordered this visit:  No orders of the defined types were placed in this encounter.      Return in about 9 months (around 10/12/2022) for DILATE OU, OCT.  There are no Patient Instructions on file for this visit.   Explained the diagnoses, plan, and follow up with the patient and they expressed understanding.  Patient expressed understanding of the importance of proper follow up care.   Alford Highland Jrake Rodriquez M.D. Diseases & Surgery of the Retina and Vitreous [facility] 01/11/22     Abbreviations: M myopia (nearsighted); A astigmatism; H hyperopia (farsighted); P presbyopia; Mrx spectacle prescription;  CTL contact lenses; OD right eye; OS left eye; OU both eyes  XT exotropia; ET esotropia; PEK punctate epithelial keratitis; PEE punctate epithelial erosions; DES dry eye syndrome; MGD meibomian gland dysfunction; ATs artificial  tears; PFAT's preservative free artificial tears; NSC nuclear sclerotic cataract; PSC posterior subcapsular cataract; ERM epi-retinal membrane; PVD posterior vitreous detachment; RD retinal detachment; DM diabetes mellitus; DR diabetic retinopathy; NPDR non-proliferative diabetic retinopathy; PDR proliferative diabetic retinopathy; CSME clinically significant macular edema; DME diabetic macular edema; dbh dot blot hemorrhages; CWS cotton wool spot; POAG primary open angle glaucoma; C/D cup-to-disc ratio; HVF humphrey visual field; GVF goldmann visual field; OCT optical coherence tomography; IOP intraocular pressure; BRVO Branch retinal vein occlusion; CRVO central retinal vein occlusion; CRAO central retinal artery occlusion; BRAO branch retinal artery occlusion; RT retinal tear; SB scleral buckle; PPV pars plana vitrectomy; VH Vitreous hemorrhage; PRP panretinal laser photocoagulation; IVK intravitreal kenalog; VMT vitreomacular traction; MH Macular hole;  NVD neovascularization of the disc; NVE neovascularization elsewhere; AREDS age related eye disease study; ARMD age related macular degeneration; POAG primary open angle glaucoma; EBMD epithelial/anterior basement membrane dystrophy; ACIOL anterior chamber intraocular lens; IOL intraocular lens; PCIOL posterior chamber intraocular lens; Phaco/IOL phacoemulsification with intraocular lens placement; PRK photorefractive keratectomy; LASIK laser assisted in situ keratomileusis; HTN hypertension; DM diabetes mellitus; COPD chronic obstructive pulmonary disease

## 2022-01-11 NOTE — Assessment & Plan Note (Signed)
Physiologic, trace no impact on acuity

## 2022-01-11 NOTE — Assessment & Plan Note (Signed)
No signs of recurrent disease in the right eye.  Now off therapy for over 2 and half year

## 2022-10-12 ENCOUNTER — Encounter (INDEPENDENT_AMBULATORY_CARE_PROVIDER_SITE_OTHER): Payer: BC Managed Care – PPO | Admitting: Ophthalmology
